# Patient Record
Sex: Male | Born: 1960 | Race: White | Hispanic: No | Marital: Married | Smoking: Former smoker
Health system: Southern US, Community
[De-identification: ages and names within clinical notes are randomized; demographics above are authoritative.]

## PROBLEM LIST (undated history)

## (undated) DIAGNOSIS — I10 Essential (primary) hypertension: Secondary | ICD-10-CM

## (undated) HISTORY — PX: VARICOSE VEIN SURGERY: SHX832

## (undated) HISTORY — PX: HERNIA REPAIR: SHX51

## (undated) HISTORY — PX: CHOLECYSTECTOMY: SHX55

## (undated) HISTORY — PX: ROTATOR CUFF REPAIR: SHX139

---

## 2019-05-29 HISTORY — PX: REPLACEMENT TOTAL KNEE BILATERAL: SUR1225

## 2020-08-04 ENCOUNTER — Other Ambulatory Visit: Payer: Self-pay

## 2020-08-04 ENCOUNTER — Emergency Department (HOSPITAL_COMMUNITY): Payer: Worker's Compensation

## 2020-08-04 ENCOUNTER — Encounter (HOSPITAL_COMMUNITY): Payer: Self-pay | Admitting: Emergency Medicine

## 2020-08-04 ENCOUNTER — Emergency Department (HOSPITAL_COMMUNITY)
Admission: EM | Admit: 2020-08-04 | Discharge: 2020-08-04 | Disposition: A | Discharge status: Home / Self Care | Payer: Worker's Compensation | Attending: Emergency Medicine | Admitting: Emergency Medicine

## 2020-08-04 DIAGNOSIS — Y990 Civilian activity done for income or pay: Secondary | ICD-10-CM | POA: Insufficient documentation

## 2020-08-04 DIAGNOSIS — Z96651 Presence of right artificial knee joint: Secondary | ICD-10-CM | POA: Insufficient documentation

## 2020-08-04 DIAGNOSIS — Z87891 Personal history of nicotine dependence: Secondary | ICD-10-CM | POA: Diagnosis not present

## 2020-08-04 DIAGNOSIS — Z91040 Latex allergy status: Secondary | ICD-10-CM | POA: Insufficient documentation

## 2020-08-04 DIAGNOSIS — I10 Essential (primary) hypertension: Secondary | ICD-10-CM | POA: Insufficient documentation

## 2020-08-04 DIAGNOSIS — W01198A Fall on same level from slipping, tripping and stumbling with subsequent striking against other object, initial encounter: Secondary | ICD-10-CM | POA: Diagnosis not present

## 2020-08-04 DIAGNOSIS — S8001XA Contusion of right knee, initial encounter: Secondary | ICD-10-CM | POA: Diagnosis not present

## 2020-08-04 DIAGNOSIS — S8991XA Unspecified injury of right lower leg, initial encounter: Secondary | ICD-10-CM | POA: Diagnosis present

## 2020-08-04 HISTORY — DX: Essential (primary) hypertension: I10

## 2020-08-04 NOTE — Discharge Instructions (Signed)
Please read instructions below. Apply ice to your knee for 20 minutes at a time.  Elevate it is much as possible to help with pain and swelling. You can take Tylenol every 6 hours as needed for pain. Schedule an appointment with the orthopedic specialist in 1 week for follow-up on your injury if symptoms persist. Return to the ER for new or concerning symptoms.

## 2020-08-04 NOTE — ED Triage Notes (Signed)
Pt c/o of rt knee pain from a fall at work today at [employer]. 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

## 2020-08-04 NOTE — ED Provider Notes (Signed)
[facility] EMERGENCY DEPARTMENT Provider Note   CSN: [id number] Arrival date & time: 08/04/20  1739     History Chief Complaint  Patient presents with  . Knee Pain    Brandon Strickland is a 59 y.o. male past medical history of hypertension, right knee placement, presenting with right knee pain after mechanical fall today at work.  He states his foot got tripped up, he fell directly onto his right knee.  He presents with concern for the wellbeing of his right knee replacement as it was done last year.  He had this done by an orthopedist in [place], he is recently relocated here.  He has not established care with PCP or orthopedist.  [facility]  He is able to weight-bear though does have a limp.  He is able to move his knee with some pain.  Pain is mostly to the anterior aspect with associated swelling.  He takes Tylenol for pain.  No other injuries reported from the fall.  The history is provided by the patient.       Past Medical History:  Diagnosis Date  . Hypertension     There are no problems to display for this patient.   Past Surgical History:  Procedure Laterality Date  . CHOLECYSTECTOMY    . HERNIA REPAIR    . 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 ROTATOR CUFF REPAIR Right   . VARICOSE VEIN SURGERY Left    4 surgery       History reviewed. No pertinent family history.  Social History   Tobacco Use  . Smoking status: Former Smoker    Quit date: 01/03/2012    Years since quitting: 8.5  . Smokeless tobacco: Never Used  Vaping Use  . Vaping Use: Never used  Substance Use Topics  . Alcohol use: Yes    Alcohol/week: 3.0 standard drinks    Types: 3 Cans of beer per week  . Drug use: Never    Home Medications Prior to Admission medications   Not on File    Allergies    Latex  Review of Systems   Review of Systems  Musculoskeletal: Positive for arthralgias.  Skin: Negative for wound.    Physical Exam Updated Vital  Signs BP (!) 150/81 (BP Location: Right Arm)   Pulse 74   Temp 98 F (36.7 C) (Oral)   Resp 18   Ht 6\' 4"  (1.93 m)   Wt 113.4 kg   SpO2 99%   BMI 30.43 kg/m   Physical Exam Vitals and nursing note reviewed.  Constitutional:      General: He is not in acute distress.    Appearance: He is well-developed.  HENT:     Head: Normocephalic and atraumatic.  Eyes:     Conjunctiva/sclera: Conjunctivae normal.  Cardiovascular:     Rate and Rhythm: Normal rate.  Pulmonary:     Effort: Pulmonary effort is normal.  Musculoskeletal:     Comments: Right knee with midline anterior surgical scar.  There is mild to moderate swelling noted.  No deformity, bruising, wounds.  He has generalized tenderness to the anterior aspect of the knee, worse along the medial and lateral joint lines.  He is able to range the knee to 90 degrees without difficulty.  Neurological:     Mental Status: He is alert.  Psychiatric:        Mood and Affect: Mood normal.        Behavior:  Behavior normal.     ED Results / Procedures / Treatments   Labs (all labs ordered are listed, but only abnormal results are displayed) Labs Reviewed - No data to display  EKG None  Radiology DG Knee Complete 4 Views Right  Result Date: 08/04/2020 CLINICAL DATA:  Fall with knee pain fall today EXAM: RIGHT KNEE - COMPLETE 4+ VIEW COMPARISON:  None. FINDINGS: The patient is status post total knee arthroplasty. No periprosthetic lucency or fracture is identified. A small knee joint effusion and prepatellar subcutaneous edema is noted. IMPRESSION: No acute osseous abnormality. Electronically Signed   By: Jonna Clark M.D.   On: 08/04/2020 19:18    Procedures Procedures (including critical care time)  Medications Ordered in ED Medications - No data to display  ED Course  I have reviewed the triage vital signs and the nursing notes.  Pertinent labs & imaging results that were available during my care of the patient were  reviewed by me and considered in my medical decision making (see chart for details).    MDM Rules/Calculators/A&P                          Patient presenting with right knee pain after trip and fall onto his right knee today at work.  He had right knee replacement done last year in [place], has recently relocated here.  He has negative plain films for periprosthetic fracture.  He has small joint effusion on x-ray, and some mild to moderate swelling on exam today.  He states he has a cane and crutches at home that he can use for nonweightbearing, he is also provided with a knee sleeve for compression and support.  He is provided with orthopedist referral for follow-up as he is relocated.  Discussed importance of ice, elevation, rest.  Tylenol as needed for pain.  Work note provided.  Patient is appropriate for discharge.  Discussed results, findings, treatment and follow up. Patient advised of return precautions. Patient verbalized understanding and agreed with plan.  Final Clinical Impression(s) / ED Diagnoses Final diagnoses:  Contusion of right knee, initial encounter    Rx / DC Orders ED Discharge Orders    None       Gabryella Murfin, [place] N, PA-C 08/04/20 2030    Dione Booze, MD 08/05/20 0010

## 2020-10-07 ENCOUNTER — Other Ambulatory Visit: Payer: Self-pay

## 2020-10-08 ENCOUNTER — Ambulatory Visit (INDEPENDENT_AMBULATORY_CARE_PROVIDER_SITE_OTHER): Payer: No Typology Code available for payment source

## 2020-10-08 ENCOUNTER — Other Ambulatory Visit: Payer: Self-pay

## 2020-10-08 ENCOUNTER — Ambulatory Visit (INDEPENDENT_AMBULATORY_CARE_PROVIDER_SITE_OTHER): Payer: No Typology Code available for payment source | Admitting: Podiatry

## 2020-10-08 DIAGNOSIS — L97522 Non-pressure chronic ulcer of other part of left foot with fat layer exposed: Secondary | ICD-10-CM

## 2020-10-08 DIAGNOSIS — M79672 Pain in left foot: Secondary | ICD-10-CM

## 2020-10-08 MED ORDER — DOXYCYCLINE HYCLATE 100 MG PO TABS: 100.0000 mg | ORAL_TABLET | Freq: Two times a day (BID) | ORAL | 0 refills | Status: DC

## 2020-10-12 ENCOUNTER — Encounter: Payer: Self-pay | Admitting: Podiatry

## 2020-10-12 NOTE — Progress Notes (Signed)
Subjective:  Patient ID: Brandon Strickland, male    DOB: [birth year],  MRN: [id number]  Chief Complaint  Patient presents with  . Wound Check    Left foot 2nd toe has an open area on the tip of the toe    60 y.o. male presents for wound care.  Patient presents with complaint of left second digit ulceration with probing down to deep bone.  Patient states been going for quite some time.  Patient states it hurts in that area.  He has not done any wound care dressing.  He has not seen anyone else prior to seeing me.  He denies any acute complaints.  He has not been taking care of the wound.  He is not a diabetic.  He states is constantly working on his foot.  He is not able to take any time off.   Review of Systems: Negative except as noted in the HPI. Denies N/V/F/Ch.  Past Medical History:  Diagnosis Date  . Hypertension     Current Outpatient Medications:  .  doxycycline (VIBRA-TABS) 100 MG tablet, Take 1 tablet (100 mg total) by mouth 2 (two) times daily., Disp: 28 tablet, Rfl: 0 .  acetaminophen (TYLENOL) 500 MG tablet, Take by mouth., Disp: , Rfl:  .  ascorbic acid (VITAMIN C) 250 MG tablet, Take by mouth., Disp: , Rfl:  .  aspirin 81 MG EC tablet, Take by mouth., Disp: , Rfl:  .  cephALEXin (KEFLEX) 500 MG capsule, Take by mouth., Disp: , Rfl:  .  Cholecalciferol 25 MCG (1000 UT) tablet, Take by mouth., Disp: , Rfl:  .  loratadine (CLARITIN) 10 MG tablet, Take by mouth., Disp: , Rfl:  .  saccharomyces boulardii (FLORASTOR) 250 MG capsule, Take by mouth., Disp: , Rfl:   Social History   Tobacco Use  Smoking Status Former Smoker  . Quit date: 01/03/2012  . Years since quitting: 8.7  Smokeless Tobacco Never Used    Allergies  Allergen Reactions  . Latex Rash   Objective:  There were no vitals filed for this visit. There is no height or weight on file to calculate BMI. Constitutional Well developed. Well nourished.  Vascular Dorsalis pedis pulses palpable  bilaterally. Posterior tibial pulses palpable bilaterally. Capillary refill normal to all digits.  No cyanosis or clubbing noted. Pedal hair growth normal.  Neurologic Normal speech. Oriented to person, place, and time. Protective sensation absent  Dermatologic Wound Location: Left second digit ulceration probes down to fat layer Wound Base: Mixed Granular/Fibrotic Peri-wound: Calloused Exudate: Scant/small amount Serous exudate Wound Measurements: -See below  Orthopedic: No pain to palpation either foot.   Radiographs: 3 views of skeletally mature adult left second digit.  No signs of osteomyelitis noted.  No cortical destruction noted.  Soft tissue deficit noted to the second digit no soft tissue emphysema noted. Assessment:   1. Toe ulcer, left, with fat layer exposed (HCC)    Plan:  Patient was evaluated and treated and all questions answered.  Ulcer left second digit with fat layer exposed -Debridement as below. -Dressed with Betadine wet-to-dry, DSD. -Patient not able to take any time off and cannot wear any closed toed shoes.  I educated him the importance of surgical shoe and to take the pressure off of the ulceration.  If he continues to get worse I discussed with him that he is a high risk of losing the digit.  He states understanding however if he does not wear steel toe boots he would lose  his job and he cannot afford to lose his job. -Doxycycline was dispensed for skin and soft tissue prophylaxis  Procedure: Excisional Debridement of Wound Tool: Sharp chisel blade/tissue nipper Rationale: Removal of non-viable soft tissue from the wound to promote healing.  Anesthesia: none Pre-Debridement Wound Measurements: 0.3 cm x 0.3 cm x 0.3 cm  Post-Debridement Wound Measurements: 0.5 cm x 0.4 cm x 0.3 cm  Type of Debridement: Sharp Excisional Tissue Removed: Non-viable soft tissue Blood loss: Minimal (<50cc) Depth of Debridement: subcutaneous tissue. Technique: Sharp  excisional debridement to bleeding, viable wound base.  Wound Progress: This is my initial evaluation I will continue to monitor the progression of the wound. Site healing conversation 7 Dressing: Dry, sterile, compression dressing. Disposition: Patient tolerated procedure well. Patient to return in 1 week for follow-up.  No follow-ups on file.

## 2020-10-29 ENCOUNTER — Ambulatory Visit (INDEPENDENT_AMBULATORY_CARE_PROVIDER_SITE_OTHER): Payer: No Typology Code available for payment source | Admitting: Podiatry

## 2020-10-29 ENCOUNTER — Other Ambulatory Visit: Payer: Self-pay

## 2020-10-29 DIAGNOSIS — L97522 Non-pressure chronic ulcer of other part of left foot with fat layer exposed: Secondary | ICD-10-CM

## 2020-11-02 ENCOUNTER — Encounter: Payer: Self-pay | Admitting: Podiatry

## 2020-11-02 NOTE — Progress Notes (Signed)
Subjective:  Patient ID: Brandon Strickland, male    DOB: 07/04/1961,  MRN: [id number]  Chief Complaint  Patient presents with  . Wound Check    PT stated that he is doing okay he has some soreness     60 y.o. male presents for wound care.  Patient presents for follow-up of left second digit ulceration.  Patient has been doing Betadine wet-to-dry dressing changes.  He states that it looks about the same.  He would like to know what the next treatment options are.   Review of Systems: Negative except as noted in the HPI. Denies N/V/F/Ch.  Past Medical History:  Diagnosis Date  . Hypertension     Current Outpatient Medications:  .  acetaminophen (TYLENOL) 500 MG tablet, Take by mouth., Disp: , Rfl:  .  ascorbic acid (VITAMIN C) 250 MG tablet, Take by mouth., Disp: , Rfl:  .  aspirin 81 MG EC tablet, Take by mouth., Disp: , Rfl:  .  cephALEXin (KEFLEX) 500 MG capsule, Take by mouth., Disp: , Rfl:  .  Cholecalciferol 25 MCG (1000 UT) tablet, Take by mouth., Disp: , Rfl:  .  doxycycline (VIBRA-TABS) 100 MG tablet, Take 1 tablet (100 mg total) by mouth 2 (two) times daily., Disp: 28 tablet, Rfl: 0 .  loratadine (CLARITIN) 10 MG tablet, Take by mouth., Disp: , Rfl:  .  saccharomyces boulardii (FLORASTOR) 250 MG capsule, Take by mouth., Disp: , Rfl:   Social History   Tobacco Use  Smoking Status Former Smoker  . Quit date: 01/03/2012  . Years since quitting: 8.8  Smokeless Tobacco Never Used    Allergies  Allergen Reactions  . Latex Rash   Objective:  There were no vitals filed for this visit. There is no height or weight on file to calculate BMI. Constitutional Well developed. Well nourished.  Vascular Dorsalis pedis pulses palpable bilaterally. Posterior tibial pulses palpable bilaterally. Capillary refill normal to all digits.  No cyanosis or clubbing noted. Pedal hair growth normal.  Neurologic Normal speech. Oriented to person, place, and time. Protective sensation absent   Dermatologic Wound Location: Left second digit ulceration probes down to fat layer Wound Base: Mixed Granular/Fibrotic Peri-wound: Calloused Exudate: Scant/small amount Serous exudate Wound Measurements: -See below  Orthopedic: No pain to palpation either foot.   Radiographs: 3 views of skeletally mature adult left second digit.  No signs of osteomyelitis noted.  No cortical destruction noted.  Soft tissue deficit noted to the second digit no soft tissue emphysema noted. Assessment:   1. Toe ulcer, left, with fat layer exposed (HCC)    Plan:  Patient was evaluated and treated and all questions answered.  Ulcer left second digit with fat layer exposed -Debridement as below. -Dressed with Betadine wet-to-dry, DSD. -Patient not able to take any time off and cannot wear any closed toed shoes.  I educated him the importance of surgical shoe and to take the pressure off of the ulceration.  If he continues to get worse I discussed with him that he is a high risk of losing the digit.  He states understanding however if he does not wear steel toe boots he would lose his job and he cannot afford to lose his job. -Doxycycline was dispensed for skin and soft tissue prophylaxis  Procedure: Excisional Debridement of Wound~stagnant Tool: Sharp chisel blade/tissue nipper Rationale: Removal of non-viable soft tissue from the wound to promote healing.  Anesthesia: none Pre-Debridement Wound Measurements: 0.3 cm x 0.3 cm x 0.3 cm  Post-Debridement Wound Measurements: 0.5 cm x 0.4 cm x 0.3 cm  Type of Debridement: Sharp Excisional Tissue Removed: Non-viable soft tissue Blood loss: Minimal (<50cc) Depth of Debridement: subcutaneous tissue. Technique: Sharp excisional debridement to bleeding, viable wound base.  Wound Progress: The wound appears to be more stagnant. Dressing: Dry, sterile, compression dressing. Disposition: Patient tolerated procedure well. Patient to return in 1 week for  follow-up.  No follow-ups on file.

## 2020-11-19 ENCOUNTER — Encounter: Payer: Self-pay | Admitting: Podiatry

## 2020-11-19 ENCOUNTER — Other Ambulatory Visit: Payer: Self-pay

## 2020-11-19 ENCOUNTER — Ambulatory Visit (INDEPENDENT_AMBULATORY_CARE_PROVIDER_SITE_OTHER): Payer: No Typology Code available for payment source | Admitting: Podiatry

## 2020-11-19 DIAGNOSIS — L97522 Non-pressure chronic ulcer of other part of left foot with fat layer exposed: Secondary | ICD-10-CM

## 2020-11-23 ENCOUNTER — Encounter: Payer: Self-pay | Admitting: Podiatry

## 2020-11-23 NOTE — Progress Notes (Signed)
Subjective:  Patient ID: Brandon Strickland, male    DOB: 1960-12-04,  MRN: [id number]  Chief Complaint  Patient presents with  . Wound Check    PT stated that he is doing well he has no concerns     60 y.o. male presents for wound care.  Patient presents for follow-up of left second digit ulceration.  Patient has been doing Betadine wet-to-dry dressing changes.  He states that it looks about the same.  He would like to know what the next treatment options are.   Review of Systems: Negative except as noted in the HPI. Denies N/V/F/Ch.  Past Medical History:  Diagnosis Date  . Hypertension     Current Outpatient Medications:  .  clobetasol ointment (TEMOVATE) 0.05 %, Apply topically., Disp: , Rfl:  .  cyclobenzaprine (FLEXERIL) 5 MG tablet, Take by mouth., Disp: , Rfl:  .  famotidine (PEPCID) 20 MG tablet, TAKE ONE TABLET BY MOUTH DAILY FOR HEARTBURN, Disp: , Rfl:  .  indomethacin (INDOCIN) 50 MG capsule, Take by mouth., Disp: , Rfl:  .  lisinopril-hydrochlorothiazide (ZESTORETIC) 10-12.5 MG tablet, Take 1 tablet by mouth daily., Disp: , Rfl:  .  lisinopril-hydrochlorothiazide (ZESTORETIC) 20-12.5 MG tablet, Take by mouth., Disp: , Rfl:  .  acetaminophen (TYLENOL) 500 MG tablet, Take by mouth., Disp: , Rfl:  .  ascorbic acid (VITAMIN C) 250 MG tablet, Take by mouth., Disp: , Rfl:  .  aspirin 81 MG EC tablet, Take by mouth., Disp: , Rfl:  .  cephALEXin (KEFLEX) 500 MG capsule, Take by mouth., Disp: , Rfl:  .  Cholecalciferol 25 MCG (1000 UT) tablet, Take by mouth., Disp: , Rfl:  .  doxycycline (VIBRA-TABS) 100 MG tablet, Take 1 tablet (100 mg total) by mouth 2 (two) times daily., Disp: 28 tablet, Rfl: 0 .  loratadine (CLARITIN) 10 MG tablet, Take by mouth., Disp: , Rfl:  .  saccharomyces boulardii (FLORASTOR) 250 MG capsule, Take by mouth., Disp: , Rfl:   Social History   Tobacco Use  Smoking Status Former Smoker  . Quit date: 01/03/2012  . Years since quitting: 8.8  Smokeless Tobacco  Never Used    Allergies  Allergen Reactions  . Latex Rash   Objective:  There were no vitals filed for this visit. There is no height or weight on file to calculate BMI. Constitutional Well developed. Well nourished.  Vascular Dorsalis pedis pulses palpable bilaterally. Posterior tibial pulses palpable bilaterally. Capillary refill normal to all digits.  No cyanosis or clubbing noted. Pedal hair growth normal.  Neurologic Normal speech. Oriented to person, place, and time. Protective sensation absent  Dermatologic Wound Location: Left second digit ulceration probes down to fat layer Wound Base: Mixed Granular/Fibrotic Peri-wound: Calloused Exudate: Scant/small amount Serous exudate Wound Measurements: -See below  Orthopedic: No pain to palpation either foot.   Radiographs: 3 views of skeletally mature adult left second digit.  No signs of osteomyelitis noted.  No cortical destruction noted.  Soft tissue deficit noted to the second digit no soft tissue emphysema noted. Assessment:   1. Toe ulcer, left, with fat layer exposed (HCC)    Plan:  Patient was evaluated and treated and all questions answered.  Ulcer left second digit with fat layer exposed -Debridement as below. -Dressed with Betadine wet-to-dry, DSD. -Patient not able to take any time off and cannot wear any closed toed shoes.  I educated him the importance of surgical shoe and to take the pressure off of the ulceration.  If  he continues to get worse I discussed with him that he is a high risk of losing the digit.  He states understanding however if he does not wear steel toe boots he would lose his job and he cannot afford to lose his job. -Doxycycline was dispensed for skin and soft tissue prophylaxis -I discussed doing flexor tenotomy to take the pressure off of the second digit and allow the soft tissue to heal appropriately.  Doing we will plan on doing tenotomy during next clinical visit if there is just not  enough improvement.  Procedure: Excisional Debridement of Wound~improving Tool: Sharp chisel blade/tissue nipper Rationale: Removal of non-viable soft tissue from the wound to promote healing.  Anesthesia: none Pre-Debridement Wound Measurements: 0.3 cm x 0.3 cm x 0.3 cm  Post-Debridement Wound Measurements: 0.4 cm x 0.4 cm x 0.3 cm  Type of Debridement: Sharp Excisional Tissue Removed: Non-viable soft tissue Blood loss: Minimal (<50cc) Depth of Debridement: subcutaneous tissue. Technique: Sharp excisional debridement to bleeding, viable wound base.  Wound Progress: The wound has slightly decreased. Dressing: Dry, sterile, compression dressing. Disposition: Patient tolerated procedure well. Patient to return in 1 week for follow-up.  No follow-ups on file.

## 2020-12-03 ENCOUNTER — Ambulatory Visit (INDEPENDENT_AMBULATORY_CARE_PROVIDER_SITE_OTHER): Payer: No Typology Code available for payment source | Admitting: Podiatry

## 2020-12-03 ENCOUNTER — Encounter: Payer: Self-pay | Admitting: Podiatry

## 2020-12-03 ENCOUNTER — Other Ambulatory Visit: Payer: Self-pay

## 2020-12-03 DIAGNOSIS — L97522 Non-pressure chronic ulcer of other part of left foot with fat layer exposed: Secondary | ICD-10-CM | POA: Diagnosis not present

## 2020-12-03 NOTE — Progress Notes (Signed)
Subjective:  Patient ID: Brandon Strickland, male    DOB: December 05, 1960,  MRN: [id number]  Chief Complaint  Patient presents with  . Foot Ulcer    Left foot toe ulcer. PT stated that he is doing well he has no concerns at this time     60 y.o. male presents for wound care.  Patient presents for follow-up of left second digit ulceration.  Patient has been doing Betadine wet-to-dry dressing changes.  He states that it has gotten better.  He would like to know what the next treatment options are.   Review of Systems: Negative except as noted in the HPI. Denies N/V/F/Ch.  Past Medical History:  Diagnosis Date  . Hypertension     Current Outpatient Medications:  .  acetaminophen (TYLENOL) 500 MG tablet, Take by mouth., Disp: , Rfl:  .  ascorbic acid (VITAMIN C) 250 MG tablet, Take by mouth., Disp: , Rfl:  .  aspirin 81 MG EC tablet, Take by mouth., Disp: , Rfl:  .  cephALEXin (KEFLEX) 500 MG capsule, Take by mouth., Disp: , Rfl:  .  Cholecalciferol 25 MCG (1000 UT) tablet, Take by mouth., Disp: , Rfl:  .  clobetasol ointment (TEMOVATE) 0.05 %, Apply topically., Disp: , Rfl:  .  cyclobenzaprine (FLEXERIL) 5 MG tablet, Take by mouth., Disp: , Rfl:  .  doxycycline (VIBRA-TABS) 100 MG tablet, Take 1 tablet (100 mg total) by mouth 2 (two) times daily., Disp: 28 tablet, Rfl: 0 .  famotidine (PEPCID) 20 MG tablet, TAKE ONE TABLET BY MOUTH DAILY FOR HEARTBURN, Disp: , Rfl:  .  indomethacin (INDOCIN) 50 MG capsule, Take by mouth., Disp: , Rfl:  .  lisinopril-hydrochlorothiazide (ZESTORETIC) 10-12.5 MG tablet, Take 1 tablet by mouth daily., Disp: , Rfl:  .  lisinopril-hydrochlorothiazide (ZESTORETIC) 20-12.5 MG tablet, Take by mouth., Disp: , Rfl:  .  loratadine (CLARITIN) 10 MG tablet, Take by mouth., Disp: , Rfl:  .  saccharomyces boulardii (FLORASTOR) 250 MG capsule, Take by mouth., Disp: , Rfl:   Social History   Tobacco Use  Smoking Status Former Smoker  . Quit date: 01/03/2012  . Years since  quitting: 8.9  Smokeless Tobacco Never Used    Allergies  Allergen Reactions  . Latex Rash   Objective:  There were no vitals filed for this visit. There is no height or weight on file to calculate BMI. Constitutional Well developed. Well nourished.  Vascular Dorsalis pedis pulses palpable bilaterally. Posterior tibial pulses palpable bilaterally. Capillary refill normal to all digits.  No cyanosis or clubbing noted. Pedal hair growth normal.  Neurologic Normal speech. Oriented to person, place, and time. Protective sensation absent  Dermatologic  left second digit ulceration completely epithelialized.  Does not probe down to bone.  No soft tissue loss noted.  No clinical signs of infection noted.  Orthopedic: No pain to palpation either foot.   Radiographs: 3 views of skeletally mature adult left second digit.  No signs of osteomyelitis noted.  No cortical destruction noted.  Soft tissue deficit noted to the second digit no soft tissue emphysema noted. Assessment:   1. Toe ulcer, left, with fat layer exposed (HCC)    Plan:  Patient was evaluated and treated and all questions answered.  Ulcer left second digit with fat layer exposed -Skin completely reepithelialized.  At this time I discussed with the patient that he will still benefit from flexor tenotomy however if the reulceration recurs then we can discuss flexor tenotomy at that time.  He  states understanding.  I discussed shoe gear modification with him.  I also discussed glucose management as well.  No follow-ups on file.

## 2020-12-31 ENCOUNTER — Encounter: Payer: Self-pay | Admitting: Physical Medicine & Rehabilitation

## 2021-02-04 ENCOUNTER — Encounter
Payer: No Typology Code available for payment source | Attending: Physical Medicine & Rehabilitation | Admitting: Physical Medicine & Rehabilitation

## 2021-02-04 ENCOUNTER — Other Ambulatory Visit: Payer: Self-pay

## 2021-02-04 ENCOUNTER — Encounter: Payer: Self-pay | Admitting: Physical Medicine & Rehabilitation

## 2021-02-04 VITALS — BP 135/76 | HR 84 | Temp 98.2°F | Ht 76.0 in | Wt 251.6 lb

## 2021-02-04 DIAGNOSIS — G8928 Other chronic postprocedural pain: Secondary | ICD-10-CM | POA: Diagnosis not present

## 2021-02-04 MED ORDER — DULOXETINE HCL 30 MG PO CPEP: 30.0000 mg | ORAL_CAPSULE | Freq: Every day | ORAL | 1 refills | Status: DC

## 2021-02-04 NOTE — Progress Notes (Signed)
Subjective:    Patient ID: Brandon Strickland, male    DOB: 06/06/1961, 60 y.o.   MRN: [id number]  HPI CC:  Bilateral knee pain 60 yo veteran referred by [facility] for the evaluation of chronic post operative knee pain.  The patient has a history of bilateral knee osteoarthritis, failed conservative care and underwent bilateral joint replacement as below. 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  He would like to retire due to his pain.  He is speaking with the [facility] about obtaining additional disability compensation.  He does not use any orthoses.  He has had no physical therapy since postoperative period  Tylenol regular strength 6-8 tabs per day Cannot work if he takes opioids Holding off on Naproxen Rx due to upcoming plastic surgery for facial lesion  Pt seen by ortho at [place] in [place], no loosening of prosthetic , orthopedic PA referred pt to eval for geniculat nerve RFA Pain Inventory Average Pain 6 Pain Right Now 8 My pain is constant, sharp, dull, stabbing, tingling, and aching  In the last 24 hours, has pain interfered with the following? General activity 2 Relation with others 3 Enjoyment of life 3 What TIME of day is your pain at its worst? morning , daytime, evening, and night Sleep (in general) NA  Pain is worse with: walking, bending, sitting, and standing Pain improves with: rest, heat/ice, and medication Relief from Meds: 3  walk with assistance use a cane ability to climb steps?  yes do you drive?  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  yes  weakness tremor tingling trouble walking spasms dizziness confusion anxiety  Any changes since  last visit?  no  Orthopedist Dr Andre Lefort [facility] physician    History reviewed. No pertinent family history. Social History   Socioeconomic History   Marital status: Married    Spouse name: Not on file   Number of children: Not on file   Years of education: Not on file   Highest education level: Not on file  Occupational History   Not on file  Tobacco Use   Smoking status: Former    Pack years: 0.00    Types: Cigarettes    Quit date: 01/03/2012    Years since quitting: 9.0   Smokeless tobacco: Never  Vaping Use   Vaping Use: Never used  Substance and Sexual Activity   Alcohol use: Yes    Alcohol/week: 3.0 standard drinks    Types: 3 Cans of beer per week   Drug use: Never   Sexual activity: Not on file  Other Topics Concern   Not on file  Social History Narrative   Not on file   Social Determinants of Health   Financial Resource Strain: Not on file  Food Insecurity: Not on file  Transportation Needs: Not on file  Physical Activity: Not on file  Stress: Not on file  Social Connections: Not on file   Past Surgical History:  Procedure Laterality Date   CHOLECYSTECTOMY     HERNIA REPAIR     REPLACEMENT TOTAL KNEE BILATERAL  05/2019   ROTATOR CUFF REPAIR Right    VARICOSE VEIN SURGERY Left  4 surgery   Past Medical History:  Diagnosis Date   Hypertension    BP 135/76   Pulse 84   Temp 98.2 F (36.8 C)   Ht 6\' 4"  (1.93 m)   Wt 251 lb 9.6 oz (114.1 kg)   SpO2 98%   BMI 30.63 kg/m   Opioid Risk Score:   Fall Risk Score:  `1  Depression screen PHQ 2/9  Depression screen PHQ 2/9 02/04/2021  Decreased Interest 3  Down, Depressed, Hopeless 1  PHQ - 2 Score 4  Altered sleeping 3  Tired, decreased energy 3  Change in appetite 0  Feeling bad or failure about yourself  2  Trouble concentrating 3  Moving slowly or fidgety/restless 3  Suicidal thoughts 0  PHQ-9 Score 18  Difficult doing work/chores Very difficult     Review of Systems   Constitutional: Negative.   HENT: Negative.    Eyes: Negative.   Respiratory: Negative.    Cardiovascular: Negative.   Gastrointestinal: Negative.   Endocrine: Negative.   Genitourinary: Negative.   Musculoskeletal:  Positive for gait problem.       Bilateral knee replacement failure  Skin: Negative.   Allergic/Immunologic: Negative.   Neurological:  Positive for tremors and weakness.       Tingling  Hematological: Negative.   Psychiatric/Behavioral:  Positive for confusion. The patient is nervous/anxious.   All other systems reviewed and are negative.     Objective:   Physical Exam Vitals and nursing note reviewed.  Constitutional:      Appearance: He is obese.  HENT:     Head: Normocephalic and atraumatic.  Eyes:     Extraocular Movements: Extraocular movements intact.     Conjunctiva/sclera: Conjunctivae normal.     Pupils: Pupils are equal, round, and reactive to light.  Cardiovascular:     Rate and Rhythm: Normal rate and regular rhythm.  Pulmonary:     Effort: Pulmonary effort is normal. No respiratory distress.     Breath sounds: Normal breath sounds.  Abdominal:     General: Abdomen is flat. Bowel sounds are normal. There is no distension.     Palpations: Abdomen is soft.  Musculoskeletal:     Cervical back: Normal range of motion.     Right lower leg: Edema present.     Left lower leg: Edema present.     Comments: -20 deg for full ext Right knee 105 deg flex right knee  -25 deg from full ext Left knee 95 deg flex Leftknee  No evidence of knee effusion bilateral   There is hypersensitivity to touch over the right knee even with light touch around the patellar area There is diffuse pain bilateral knees to palpation medial and lateral joint line supra and infrapatellar as well as popliteal space.  Skin:    General: Skin is warm and dry.     Comments: Stasis dermatitis changes bilateral legs   Neurological:     Mental Status: He is alert and oriented to  person, place, and time.  Psychiatric:        Mood and Affect: Mood normal.        Behavior: Behavior normal.        Thought Content: Thought content normal.        Judgment: Judgment normal.         Assessment & Plan:   Chronic postoperative knee pain bilateral, right greater than left.  He has some limited range of motion both with extension and  with flexion of both knees.  He has been evaluated by orthopedics and no loosening or hardware malpositioning was detected.  The patient is unable to take narcotic analgesics due to his work, he has had minimal relief with over-the-counter medications. We discussed other treatment alternatives including a trial of Voltaren gel as well as Cymbalta. We discussed genicular nerve blocks as requested by orthopedics.  This would be to determine whether or not genicular nerve radiofrequency neurotomy would be helpful. We will schedule this in 3 to 4 weeks. Discussed with patient agrees with plan

## 2021-02-04 NOTE — Patient Instructions (Addendum)
Will start with Right genicular nerve block , this is a test block that is temporary but helps predict success with radiofrequency  Try voltaren gel to both knees , it is OTC, please apply 4 x per day

## 2021-02-21 ENCOUNTER — Emergency Department (HOSPITAL_COMMUNITY)
Admission: EM | Admit: 2021-02-21 | Discharge: 2021-02-21 | Disposition: A | Discharge status: Home / Self Care | Payer: No Typology Code available for payment source | Attending: Emergency Medicine | Admitting: Emergency Medicine

## 2021-02-21 ENCOUNTER — Emergency Department (HOSPITAL_COMMUNITY): Payer: No Typology Code available for payment source

## 2021-02-21 ENCOUNTER — Other Ambulatory Visit: Payer: Self-pay

## 2021-02-21 ENCOUNTER — Encounter (HOSPITAL_COMMUNITY): Payer: Self-pay | Admitting: *Deleted

## 2021-02-21 DIAGNOSIS — Z91040 Latex allergy status: Secondary | ICD-10-CM | POA: Insufficient documentation

## 2021-02-21 DIAGNOSIS — Z87891 Personal history of nicotine dependence: Secondary | ICD-10-CM | POA: Diagnosis not present

## 2021-02-21 DIAGNOSIS — U071 COVID-19: Secondary | ICD-10-CM | POA: Diagnosis not present

## 2021-02-21 DIAGNOSIS — M7989 Other specified soft tissue disorders: Secondary | ICD-10-CM | POA: Insufficient documentation

## 2021-02-21 DIAGNOSIS — Z96653 Presence of artificial knee joint, bilateral: Secondary | ICD-10-CM | POA: Diagnosis not present

## 2021-02-21 DIAGNOSIS — R059 Cough, unspecified: Secondary | ICD-10-CM | POA: Diagnosis present

## 2021-02-21 DIAGNOSIS — L03116 Cellulitis of left lower limb: Secondary | ICD-10-CM | POA: Diagnosis not present

## 2021-02-21 DIAGNOSIS — Z79899 Other long term (current) drug therapy: Secondary | ICD-10-CM | POA: Diagnosis not present

## 2021-02-21 DIAGNOSIS — I10 Essential (primary) hypertension: Secondary | ICD-10-CM | POA: Insufficient documentation

## 2021-02-21 LAB — RESP PANEL BY RT-PCR (FLU A&B, COVID) ARPGX2
Influenza A by PCR: NEGATIVE
Influenza B by PCR: NEGATIVE
SARS Coronavirus 2 by RT PCR: POSITIVE — AB

## 2021-02-21 MED ORDER — CEPHALEXIN 500 MG PO CAPS: 500.0000 mg | ORAL_CAPSULE | Freq: Four times a day (QID) | ORAL | 0 refills | Status: AC

## 2021-02-21 NOTE — Discharge Instructions (Signed)
You were evaluated in the Emergency Department and after careful evaluation, we did not find any emergent condition requiring admission or further testing in the hospital.  Your DVT study today was negative.  As discussed, we will treat this as a skin infection of the left lower leg.  Please take cephalexin as directed until finished.  Please make sure to follow-up with your primary care doctor  Please return to the Emergency Department if you experience any worsening of your condition.    Thank you for allowing [place] to be a part of your care.

## 2021-02-21 NOTE — ED Provider Notes (Signed)
[facility] EMERGENCY DEPARTMENT Provider Note   CSN: [id number] Arrival date & time: 02/21/21  1350     History No chief complaint on file.   Brandon Strickland is a 60 y.o. male.  HPI 60 year old male with a history of hypertension presents to the ER with left leg swelling and pain.  Patient states that he started to show some URI symptoms on Friday with nasal congestion and cough.  He took a home COVID test which was negative.  He states he progressively felt better throughout the weekend but then noticed on Sunday his left leg became little more red and swollen and painful.  He reports calling his PCP today and was told to come to the ER for evaluation of blood clot.  He denies any active chest pain or shortness of breath.  He states he did have a clot in his left lower leg back in the 80s.  He is not anticoagulated.  Denies any numbness or tingling.    Past Medical History:  Diagnosis Date   Hypertension     Patient Active Problem List   Diagnosis Date Noted   Chronic postoperative pain 02/04/2021    Past Surgical History:  Procedure Laterality Date   CHOLECYSTECTOMY     HERNIA REPAIR     REPLACEMENT TOTAL KNEE BILATERAL  05/2019   ROTATOR CUFF REPAIR Right    VARICOSE VEIN SURGERY Left    4 surgery       No family history on file.  Social History   Tobacco Use   Smoking status: Former    Pack years: 0.00    Types: Cigarettes    Quit date: 01/03/2012    Years since quitting: 9.1   Smokeless tobacco: Never  Vaping Use   Vaping Use: Never used  Substance Use Topics   Alcohol use: Yes    Alcohol/week: 3.0 standard drinks    Types: 3 Cans of beer per week   Drug use: Never    Home Medications Prior to Admission medications   Medication Sig Start Date End Date Taking? Authorizing Provider  cephALEXin (KEFLEX) 500 MG capsule Take 1 capsule (500 mg total) by mouth 4 (four) times daily for 7 days. 02/21/21 02/28/21 Yes Mare Ferrari, PA-C  acetaminophen (TYLENOL)  500 MG tablet Take by mouth. 02/07/20   [provider]  ascorbic acid (VITAMIN C) 250 MG tablet Take by mouth.    [provider]  Cholecalciferol 25 MCG (1000 UT) tablet Take by mouth.    [provider]  clobetasol ointment (TEMOVATE) 0.05 % Apply topically. 07/28/15   [provider]  DULoxetine (CYMBALTA) 30 MG capsule Take 1 capsule (30 mg total) by mouth daily. 02/04/21   Kirsteins, Victorino Sparrow, MD  famotidine (PEPCID) 20 MG tablet TAKE ONE TABLET BY MOUTH DAILY FOR HEARTBURN 01/12/20   [provider]  lisinopril-hydrochlorothiazide (ZESTORETIC) 10-12.5 MG tablet Take 1 tablet by mouth daily. 01/12/20   [provider]  loratadine (CLARITIN) 10 MG tablet Take by mouth.    [provider]    Allergies    Latex and Tape  Review of Systems   Review of Systems  Constitutional:  Negative for chills and fever.  Musculoskeletal:  Positive for arthralgias.  Skin:  Positive for color change.  Neurological:  Negative for weakness and numbness.   Physical Exam Updated Vital Signs BP 137/85 (BP Location: Right Arm)   Pulse 90   Temp 98.1 F (36.7 C) (Oral)   Resp  16   Ht 6\' 4"  (1.93 m)   Wt 113.4 kg   SpO2 99%   BMI 30.43 kg/m   Physical Exam Vitals and nursing note reviewed.  Constitutional:      General: He is not in acute distress.    Appearance: He is well-developed. He is not ill-appearing, toxic-appearing or diaphoretic.  HENT:     Head: Normocephalic and atraumatic.  Eyes:     Conjunctiva/sclera: Conjunctivae normal.  Cardiovascular:     Rate and Rhythm: Normal rate and regular rhythm.     Heart sounds: No murmur heard. Pulmonary:     Effort: Pulmonary effort is normal. No respiratory distress.     Breath sounds: Normal breath sounds.  Abdominal:     Palpations: Abdomen is soft.     Tenderness: There is no abdominal tenderness.  Musculoskeletal:        General: Swelling and tenderness present.     Cervical  back: Neck supple.     Right lower leg: No edema.     Comments: Left lower extremity with area of erythema.  2+ DP pulses.<2 cap refill.  Mild warmth to the touch.  No palpable abscess.  Skin:    General: Skin is warm and dry.     Findings: Erythema present.  Neurological:     General: No focal deficit present.     Mental Status: He is alert and oriented to person, place, and time.     Sensory: No sensory deficit.     Motor: No weakness.       ED Results / Procedures / Treatments   Labs (all labs ordered are listed, but only abnormal results are displayed) Labs Reviewed  RESP PANEL BY RT-PCR (FLU A&B, COVID) [id number]    EKG None  Radiology Venous Img Lower Unilateral Left  Result Date: 02/21/2021 CLINICAL DATA:  Lower extremity edema EXAM: LEFT LOWER EXTREMITY VENOUS DUPLEX ULTRASOUND TECHNIQUE: Gray-scale sonography with graded compression, as well as color Doppler and duplex ultrasound were performed to evaluate the left lower extremity deep venous system from the level of the common femoral vein and including the common femoral, femoral, profunda femoral, popliteal and calf veins including the posterior tibial, peroneal and gastrocnemius veins when visible. The superficial great saphenous vein was also interrogated. Spectral Doppler was utilized to evaluate flow at rest and with distal augmentation maneuvers in the common femoral, femoral and popliteal veins. COMPARISON:  None. FINDINGS: Contralateral Common Femoral Vein: Respiratory phasicity is normal and symmetric with the symptomatic side. No evidence of thrombus. Normal compressibility. Common Femoral Vein: No evidence of thrombus. Normal compressibility, respiratory phasicity and response to augmentation. Saphenofemoral Junction: Not appreciable. Apparent previous greater saphenous vein stripping may result in loss of saphenofemoral junction vascularity. Profunda Femoral Vein: No evidence of thrombus. Normal compressibility and  flow on color Doppler imaging. Femoral Vein: No evidence of thrombus. Normal compressibility, respiratory phasicity and response to augmentation. Popliteal Vein: No evidence of thrombus. Normal compressibility, respiratory phasicity and response to augmentation. Calf Veins: No evidence of thrombus. Normal compressibility and flow on color Doppler imaging. Superficial Great Saphenous Vein: Previous greater saphenous vein stripping procedure. Venous Reflux:  None. Other Findings:  None. IMPRESSION: No evidence of deep venous thrombosis in the left lower extremity. Apparent previous left greater saphenous vein stripping procedure. Right common femoral vein patent. Right saphenofemoral junction visualized and patent. Electronically Signed   By: 02/23/2021 III M.D.   On: 02/21/2021 15:42    Procedures Procedures  Medications Ordered in ED Medications - No data to display  ED Course  I have reviewed the triage vital signs and the nursing notes.  Pertinent labs & imaging results that were available during my care of the patient were reviewed by me and considered in my medical decision making (see chart for details).    MDM Rules/Calculators/A&P                          60 year old male with left lower extremity erythema and swelling x2 days.  On arrival, is well-appearing, no acute distress, resting comfortably in the ER bed.  Afebrile, not tachycardic, tachypneic or hypoxic.  No chest pain or shortness of breath to suggest PE.  Left lower extremity DVT study without evidence of clot, he does have apparent previous left greater saphenous vein stripping procedure.  Will treat as cellulitis, encouraged PCP follow-up.  COVID test pending.  Overall well-appearing.  Low suspicion for sepsis.  We discussed return precautions.  Patient was understanding is agreeable.  Stable for discharge Final Clinical Impression(s) / ED Diagnoses Final diagnoses:  Left leg cellulitis    Rx / DC Orders ED  Discharge Orders          Ordered    cephALEXin (KEFLEX) 500 MG capsule  4 times daily        02/21/21 1615             Mare Ferrari, PA-C 02/21/21 1646    Sabas Sous, MD 02/23/21 [phone]

## 2021-02-21 NOTE — ED Triage Notes (Signed)
Referred by PCP for possible blood clot in left leg

## 2021-03-22 ENCOUNTER — Encounter: Payer: Self-pay | Admitting: Physical Medicine & Rehabilitation

## 2021-03-22 ENCOUNTER — Other Ambulatory Visit: Payer: Self-pay

## 2021-03-22 ENCOUNTER — Encounter
Payer: No Typology Code available for payment source | Attending: Physical Medicine & Rehabilitation | Admitting: Physical Medicine & Rehabilitation

## 2021-03-22 VITALS — BP 141/80 | HR 79 | Temp 98.2°F | Ht 76.0 in | Wt 251.8 lb

## 2021-03-22 DIAGNOSIS — G8928 Other chronic postprocedural pain: Secondary | ICD-10-CM | POA: Insufficient documentation

## 2021-03-22 DIAGNOSIS — L03116 Cellulitis of left lower limb: Secondary | ICD-10-CM | POA: Diagnosis not present

## 2021-03-22 DIAGNOSIS — A4151 Sepsis due to Escherichia coli [E. coli]: Secondary | ICD-10-CM | POA: Diagnosis not present

## 2021-03-22 NOTE — Patient Instructions (Signed)
Genicular nerve blocks were performed today. This is to block pain signals from the knee. A local anesthetic was used to block the knee therefore this will not be a permanent procedure. Please keep track of your pain today and compared to the pain that you had prior to the injection. At next visit we will discuss the results. If it is helpful but only short-term we would need to confirm this with an additional injection prior to proceeding with a radiofrequency neurotomy  °

## 2021-03-22 NOTE — Progress Notes (Signed)
RIght  genicular nerve blocks under fluoroscopic guidance  Indication Chronic severe post operative knee pain that has not responded to PT, medication, and other conservative care.  Informed consent was obtained after discussing risks and benefits of procedure with the patient.  These include bleeding, bruising and infection as well as foot numbness. Pt placed in supine position on the fluoro table .  Static images identified distal femur and proximal tibia.  Lateral and medial supracondylar , as well as medial tibial flare prepped with betadine.  Marked under fluoro and then a 25 g 1.5 inch needle was used to anesthetize skin and subcutaneous tissue. 1.5 cc was infiltrate into each of 3 sites. Then a 22-gauge 3.5 inch needle was inserted under fluoroscopic guidance first targeting the medial tibial flare midpoint. After bone contact was made lateral images confirmed proper positioning and Isovue 200 times one ML was injected. This showed no evidence of intravascular uptake. Then 1.5 ML of the solution containing one ML of Celestone 6 mg per mL with 4 ML of .25%marcaincaine was injected. This same procedure was treated for the lateral and medial supracondylar areas. Patient tolerated procedure well. Post procedure instructions given.   Preinjection Right knee pain 7/10 Post injection RIght knee pain 3/10

## 2021-03-22 NOTE — Progress Notes (Signed)
  PROCEDURE RECORD [facility]   Name: Brandon Strickland DOB:1960-09-06 MRN: [id number]  Date:03/22/2021  Physician: Claudette Laws, MD    Nurse/CMA: Nedra Hai CMA  Allergies:  Allergies  Allergen Reactions   Latex Rash   Tape     Consent Signed: Yes.    Is patient diabetic? No.  CBG today?   Pregnant: No. LMP: No LMP for male patient. (age [age])  Anticoagulants: no Anti-inflammatory: no Antibiotics: no  Procedure: right genicular nerve block  Position: Supine Start Time: 11:02 am End Time: 11:16am  Fluoro Time: 49  RN/CMA Brandon Stecklein RN Nedra Hai CMA    Time 1034 11:27am    BP 141/80 91/53    Pulse 79 79    Respirations 14 14    O2 Sat 99 99    S/S 6 6    Pain Level 7/10 4/10     Patient A & O X 3, D/C instructions reviewed, and sits independently.

## 2021-03-25 ENCOUNTER — Inpatient Hospital Stay (HOSPITAL_COMMUNITY)
Admission: EM | Admit: 2021-03-25 | Discharge: 2021-03-28 | DRG: 872 | Disposition: A | Discharge status: Home / Self Care | Payer: No Typology Code available for payment source | Attending: Family Medicine | Admitting: Family Medicine

## 2021-03-25 ENCOUNTER — Emergency Department (HOSPITAL_COMMUNITY): Payer: No Typology Code available for payment source

## 2021-03-25 ENCOUNTER — Encounter (HOSPITAL_COMMUNITY): Payer: Self-pay | Admitting: Emergency Medicine

## 2021-03-25 ENCOUNTER — Other Ambulatory Visit: Payer: Self-pay

## 2021-03-25 DIAGNOSIS — Z91048 Other nonmedicinal substance allergy status: Secondary | ICD-10-CM

## 2021-03-25 DIAGNOSIS — Z87891 Personal history of nicotine dependence: Secondary | ICD-10-CM

## 2021-03-25 DIAGNOSIS — A419 Sepsis, unspecified organism: Secondary | ICD-10-CM | POA: Diagnosis not present

## 2021-03-25 DIAGNOSIS — B9620 Unspecified Escherichia coli [E. coli] as the cause of diseases classified elsewhere: Secondary | ICD-10-CM | POA: Diagnosis not present

## 2021-03-25 DIAGNOSIS — E669 Obesity, unspecified: Secondary | ICD-10-CM | POA: Diagnosis present

## 2021-03-25 DIAGNOSIS — A4151 Sepsis due to Escherichia coli [E. coli]: Secondary | ICD-10-CM | POA: Diagnosis present

## 2021-03-25 DIAGNOSIS — L02416 Cutaneous abscess of left lower limb: Secondary | ICD-10-CM | POA: Diagnosis present

## 2021-03-25 DIAGNOSIS — I8392 Asymptomatic varicose veins of left lower extremity: Secondary | ICD-10-CM | POA: Diagnosis present

## 2021-03-25 DIAGNOSIS — K219 Gastro-esophageal reflux disease without esophagitis: Secondary | ICD-10-CM | POA: Diagnosis present

## 2021-03-25 DIAGNOSIS — Z79899 Other long term (current) drug therapy: Secondary | ICD-10-CM | POA: Diagnosis not present

## 2021-03-25 DIAGNOSIS — I10 Essential (primary) hypertension: Secondary | ICD-10-CM | POA: Diagnosis present

## 2021-03-25 DIAGNOSIS — I878 Other specified disorders of veins: Secondary | ICD-10-CM | POA: Diagnosis present

## 2021-03-25 DIAGNOSIS — G629 Polyneuropathy, unspecified: Secondary | ICD-10-CM | POA: Diagnosis present

## 2021-03-25 DIAGNOSIS — Z6830 Body mass index (BMI) 30.0-30.9, adult: Secondary | ICD-10-CM

## 2021-03-25 DIAGNOSIS — L02612 Cutaneous abscess of left foot: Secondary | ICD-10-CM | POA: Diagnosis present

## 2021-03-25 DIAGNOSIS — R7881 Bacteremia: Secondary | ICD-10-CM | POA: Diagnosis present

## 2021-03-25 DIAGNOSIS — L03116 Cellulitis of left lower limb: Secondary | ICD-10-CM

## 2021-03-25 DIAGNOSIS — R609 Edema, unspecified: Secondary | ICD-10-CM

## 2021-03-25 DIAGNOSIS — Z91040 Latex allergy status: Secondary | ICD-10-CM | POA: Diagnosis not present

## 2021-03-25 DIAGNOSIS — I872 Venous insufficiency (chronic) (peripheral): Secondary | ICD-10-CM | POA: Diagnosis present

## 2021-03-25 DIAGNOSIS — Z20822 Contact with and (suspected) exposure to covid-19: Secondary | ICD-10-CM | POA: Diagnosis present

## 2021-03-25 LAB — RESP PANEL BY RT-PCR (FLU A&B, COVID) ARPGX2
Influenza A by PCR: NEGATIVE
Influenza B by PCR: NEGATIVE
SARS Coronavirus 2 by RT PCR: NEGATIVE

## 2021-03-25 LAB — CBC WITH DIFFERENTIAL/PLATELET
Abs Immature Granulocytes: 0.07 10*3/uL (ref 0.00–0.07)
Basophils Absolute: 0 10*3/uL (ref 0.0–0.1)
Basophils Relative: 0 %
Eosinophils Absolute: 0.1 10*3/uL (ref 0.0–0.5)
Eosinophils Relative: 1 %
HCT: 41.3 % (ref 39.0–52.0)
Hemoglobin: 14 g/dL (ref 13.0–17.0)
Immature Granulocytes: 1 %
Lymphocytes Relative: 6 %
Lymphs Abs: 0.7 10*3/uL (ref 0.7–4.0)
MCH: 33.8 pg (ref 26.0–34.0)
MCHC: 33.9 g/dL (ref 30.0–36.0)
MCV: 99.8 fL (ref 80.0–100.0)
Monocytes Absolute: 0.1 10*3/uL (ref 0.1–1.0)
Monocytes Relative: 1 %
Neutro Abs: 9.5 10*3/uL — ABNORMAL HIGH (ref 1.7–7.7)
Neutrophils Relative %: 91 %
Platelets: 191 10*3/uL (ref 150–400)
RBC: 4.14 MIL/uL — ABNORMAL LOW (ref 4.22–5.81)
RDW: 13.2 % (ref 11.5–15.5)
WBC: 10.3 10*3/uL (ref 4.0–10.5)
nRBC: 0 % (ref 0.0–0.2)

## 2021-03-25 LAB — COMPREHENSIVE METABOLIC PANEL
ALT: 23 U/L (ref 0–44)
AST: 19 U/L (ref 15–41)
Albumin: 4.1 g/dL (ref 3.5–5.0)
Alkaline Phosphatase: 59 U/L (ref 38–126)
Anion gap: 7 (ref 5–15)
BUN: 22 mg/dL (ref 8–23)
CO2: 26 mmol/L (ref 22–32)
Calcium: 9 mg/dL (ref 8.9–10.3)
Chloride: 105 mmol/L (ref 98–111)
Creatinine, Ser: 0.86 mg/dL (ref 0.61–1.24)
GFR, Estimated: >60 mL/min (ref >60)
Glucose, Bld: 102 mg/dL — ABNORMAL HIGH (ref 70–99)
Potassium: 3.8 mmol/L (ref 3.5–5.1)
Sodium: 138 mmol/L (ref 135–145)
Total Bilirubin: 1.1 mg/dL (ref 0.3–1.2)
Total Protein: 7.3 g/dL (ref 6.5–8.1)

## 2021-03-25 LAB — URINALYSIS, ROUTINE W REFLEX MICROSCOPIC
Bilirubin Urine: NEGATIVE
Glucose, UA: NEGATIVE mg/dL
Hgb urine dipstick: NEGATIVE
Ketones, ur: NEGATIVE mg/dL
Leukocytes,Ua: NEGATIVE
Nitrite: NEGATIVE
Protein, ur: NEGATIVE mg/dL
Specific Gravity, Urine: 1.025 (ref 1.005–1.030)
pH: 8 (ref 5.0–8.0)

## 2021-03-25 LAB — LACTIC ACID, PLASMA
Lactic Acid, Venous: 1.6 mmol/L (ref 0.5–1.9)
Lactic Acid, Venous: 1.7 mmol/L (ref 0.5–1.9)

## 2021-03-25 LAB — HIV ANTIBODY (ROUTINE TESTING W REFLEX): HIV Screen 4th Generation wRfx: NONREACTIVE

## 2021-03-25 LAB — APTT: aPTT: 22 seconds — ABNORMAL LOW (ref 24–36)

## 2021-03-25 LAB — PROTIME-INR
INR: 1.1 (ref 0.8–1.2)
Prothrombin Time: 13.8 seconds (ref 11.4–15.2)

## 2021-03-25 LAB — D-DIMER, QUANTITATIVE: D-Dimer, Quant: 1.71 ug/mL-FEU — ABNORMAL HIGH (ref 0.00–0.50)

## 2021-03-25 MED ORDER — ACETAMINOPHEN 650 MG RE SUPP: 650.0000 mg | Freq: Four times a day (QID) | RECTAL | Status: DC | PRN

## 2021-03-25 MED ORDER — ONDANSETRON HCL 4 MG PO TABS: 4.0000 mg | ORAL_TABLET | Freq: Four times a day (QID) | ORAL | Status: DC | PRN

## 2021-03-25 MED ORDER — HYDROCHLOROTHIAZIDE 12.5 MG PO CAPS
12.5000 mg | ORAL_CAPSULE | Freq: Every day | ORAL | Status: DC
  Administered 2021-03-25 – 2021-03-28 (×4): 12.5 mg via ORAL
  Filled 2021-03-25 (×4): qty 1

## 2021-03-25 MED ORDER — DULOXETINE HCL 30 MG PO CPEP: 30.0000 mg | ORAL_CAPSULE | Freq: Every day | ORAL | Status: DC

## 2021-03-25 MED ORDER — LISINOPRIL-HYDROCHLOROTHIAZIDE 10-12.5 MG PO TABS: 1.0000 | ORAL_TABLET | Freq: Every day | ORAL | Status: DC

## 2021-03-25 MED ORDER — ENOXAPARIN SODIUM 60 MG/0.6ML IJ SOSY
55.0000 mg | PREFILLED_SYRINGE | INTRAMUSCULAR | Status: DC
  Administered 2021-03-25 – 2021-03-27 (×3): 55 mg via SUBCUTANEOUS
  Filled 2021-03-25 (×3): qty 0.6

## 2021-03-25 MED ORDER — ASCORBIC ACID 500 MG PO TABS
500.0000 mg | ORAL_TABLET | Freq: Every day | ORAL | Status: DC
  Administered 2021-03-25 – 2021-03-28 (×4): 500 mg via ORAL
  Filled 2021-03-25 (×4): qty 1

## 2021-03-25 MED ORDER — LACTATED RINGERS IV BOLUS (SEPSIS)
1000.0000 mL | Freq: Once | INTRAVENOUS | Status: AC
Start: 2021-03-25 — End: 2021-03-25
  Administered 2021-03-25: 1000 mL via INTRAVENOUS

## 2021-03-25 MED ORDER — LISINOPRIL 10 MG PO TABS
10.0000 mg | ORAL_TABLET | Freq: Every day | ORAL | Status: DC
  Administered 2021-03-25 – 2021-03-28 (×4): 10 mg via ORAL
  Filled 2021-03-25 (×4): qty 1

## 2021-03-25 MED ORDER — LORATADINE 10 MG PO TABS
10.0000 mg | ORAL_TABLET | Freq: Every day | ORAL | Status: DC
  Administered 2021-03-25 – 2021-03-28 (×4): 10 mg via ORAL
  Filled 2021-03-25 (×4): qty 1

## 2021-03-25 MED ORDER — ACETAMINOPHEN 500 MG PO TABS
1000.0000 mg | ORAL_TABLET | Freq: Once | ORAL | Status: AC
  Administered 2021-03-25: 1000 mg via ORAL
  Filled 2021-03-25: qty 2

## 2021-03-25 MED ORDER — LACTATED RINGERS IV BOLUS (SEPSIS)
1000.0000 mL | Freq: Once | INTRAVENOUS | Status: AC
  Administered 2021-03-25: 1000 mL via INTRAVENOUS

## 2021-03-25 MED ORDER — FAMOTIDINE 20 MG PO TABS
20.0000 mg | ORAL_TABLET | Freq: Every day | ORAL | Status: DC
  Administered 2021-03-25 – 2021-03-28 (×4): 20 mg via ORAL
  Filled 2021-03-25 (×4): qty 1

## 2021-03-25 MED ORDER — SODIUM CHLORIDE 0.9 % IV SOLN
2.0000 g | INTRAVENOUS | Status: DC
  Administered 2021-03-25 – 2021-03-27 (×3): 2 g via INTRAVENOUS
  Filled 2021-03-25 (×3): qty 20

## 2021-03-25 MED ORDER — ACETAMINOPHEN 325 MG PO TABS
650.0000 mg | ORAL_TABLET | Freq: Four times a day (QID) | ORAL | Status: DC | PRN
  Administered 2021-03-25 – 2021-03-27 (×4): 650 mg via ORAL
  Filled 2021-03-25 (×4): qty 2

## 2021-03-25 MED ORDER — SODIUM CHLORIDE 0.9% FLUSH
3.0000 mL | Freq: Two times a day (BID) | INTRAVENOUS | Status: DC
  Administered 2021-03-27 – 2021-03-28 (×3): 3 mL via INTRAVENOUS

## 2021-03-25 MED ORDER — SODIUM CHLORIDE 0.9 % IV SOLN: 2.0000 g | INTRAVENOUS | Status: DC

## 2021-03-25 MED ORDER — ONDANSETRON HCL 4 MG/2ML IJ SOLN: 4.0000 mg | Freq: Four times a day (QID) | INTRAMUSCULAR | Status: DC | PRN

## 2021-03-25 MED ORDER — IOHEXOL 350 MG/ML SOLN
100.0000 mL | Freq: Once | INTRAVENOUS | Status: AC | PRN
  Administered 2021-03-25: 100 mL via INTRAVENOUS

## 2021-03-25 MED ORDER — LACTATED RINGERS IV SOLN: INTRAVENOUS | Status: DC

## 2021-03-25 MED ORDER — LACTATED RINGERS IV BOLUS (SEPSIS)
500.0000 mL | Freq: Once | INTRAVENOUS | Status: AC
  Administered 2021-03-25: 500 mL via INTRAVENOUS

## 2021-03-25 MED ORDER — SODIUM CHLORIDE 0.9 % IV SOLN: 250.0000 mL | INTRAVENOUS | Status: DC | PRN

## 2021-03-25 MED ORDER — SODIUM CHLORIDE 0.9% FLUSH: 3.0000 mL | INTRAVENOUS | Status: DC | PRN

## 2021-03-25 NOTE — Sepsis Progress Note (Signed)
eLink is monitoring this Code Sepsis. °

## 2021-03-25 NOTE — Progress Notes (Signed)
Lab called to state patient had two positive blood cultures for gram negative rods

## 2021-03-25 NOTE — ED Triage Notes (Signed)
Pt c/o left leg pain that started this morning. Leg is red and swollen. Pt states he just finished antibiotics a month ago for cellulitis. Pt has known covid exposure on Tuesday. Also c/o cough and fever.

## 2021-03-25 NOTE — H&P (Addendum)
History and Physical    Brandon Strickland ZOX:[id number] DOB: 1961-08-18 DOA: 03/25/2021  PCP: Orvilla Cornwall, MD   Patient coming from: Home  I have personally briefly reviewed patient's old medical records in [facility]  Chief Complaint: Left leg pain  HPI: Brandon Strickland is a 60 y.o. male with medical history significant for hypertension who presents to the emergency room for evaluation of pain involving his left leg which started on the day of admission.  He rates his pain a 6 x 10 in intensity at its worst.  Pain is nonradiating and is associated with redness, fever and chills.  Patient states that he recently completed antibiotics a month ago for cellulitis and also notes that he had a recent COVID exposure couple of days prior to his admission. He denies any recent trauma, no falls, no abdominal pain, no changes in his bowel habits, no urinary symptoms, no dizziness, no lightheadedness, no headache, no palpitations, no diaphoresis, no chest pain or shortness of breath. Labs show sodium 138, potassium 3.8, chloride 105, bicarb 26, glucose 102, BUN 22, creatinine 0.86, calcium 9.0, alkaline phosphatase 59, albumin 4.1, AST 19, ALT 23, total protein 7.3, lactic acid 1.6, white count 10.3, hemoglobin 14.0, hematocrit 41.3, MCV 99.8, RDW 13.2, platelet count 191, D-dimer 1.79, PT 13.8, INR 1.1 Respiratory viral panel is negative Chest x-ray reviewed by me shows Mild cardiomegaly and central pulmonary vascular congestion without overt pulmonary edema. No consolidation. Left lower extremity venous Doppler is negative for DVT but shows dilated varicose veins of the left lower extremity related to chronic venous insufficiency. CT angiogram of the chest is negative for acute pulmonary embolism, pneumonia or acute cardiopulmonary process. Twelve-lead EKG reviewed by me shows sinus tachycardia with left anterior fascicular block and right bundle branch block   ED Course: Patient is a 60 year old male  who presents to the ER for evaluation of pain in his left leg associated with redness, swelling and differential warmth as well as fever and chills.  He had a T-max of 103F upon arrival to the ER and was tachycardic and tachypneic. Lactic acid level was within normal limits and he has no leukocytosis. He received 3.5 L fluid bolus as well as a dose of Rocephin 2 g IV x1 He will be admitted to the hospital for further evaluation.    Review of Systems: As per HPI otherwise all other systems reviewed and negative.    Past Medical History:  Diagnosis Date   Hypertension     Past Surgical History:  Procedure Laterality Date   CHOLECYSTECTOMY     HERNIA REPAIR     REPLACEMENT TOTAL KNEE BILATERAL  05/2019   ROTATOR CUFF REPAIR Right    VARICOSE VEIN SURGERY Left    4 surgery     reports that he quit smoking about 9 years ago. His smoking use included cigarettes. He has never used smokeless tobacco. He reports current alcohol use of about 3.0 standard drinks of alcohol per week. He reports that he does not use drugs.  Allergies  Allergen Reactions   Latex Rash    Other reaction(s): rash/itching   Tape     Family History  Problem Relation Age of Onset   Diabetes Mellitus II Mother       Prior to Admission medications   Medication Sig Start Date End Date Taking? Authorizing Provider  acetaminophen (TYLENOL) 500 MG tablet Take by mouth. 02/07/20   [provider]  ascorbic acid (VITAMIN C) 250 MG  tablet Take by mouth.    [provider]  Cholecalciferol 25 MCG (1000 UT) tablet Take by mouth.    [provider]  clobetasol ointment (TEMOVATE) 0.05 % Apply topically. 07/28/15   [provider]  DULoxetine (CYMBALTA) 30 MG capsule Take 1 capsule (30 mg total) by mouth daily. 02/04/21   Kirsteins, Victorino SparrowAndrew E, MD  famotidine (PEPCID) 20 MG tablet TAKE ONE TABLET BY MOUTH DAILY FOR HEARTBURN 01/12/20   [provider]   lisinopril-hydrochlorothiazide (ZESTORETIC) 10-12.5 MG tablet Take 1 tablet by mouth daily. 01/12/20   [provider]  loratadine (CLARITIN) 10 MG tablet Take by mouth.    [provider]  naproxen (NAPROSYN) 500 MG tablet TAKE ONE TABLET BY MOUTH TWICE A DAY AS NEEDED FOR PAIN (TAKE WITH FOOD) 01/22/21   [provider]    Physical Exam: Vitals:   03/25/21 1330 03/25/21 1400 03/25/21 1430 03/25/21 1500  BP: 137/61   109/68  Pulse: (!) 106 (!) 106 (!) 103 95  Resp: (!) 22 17 (!) 21 (!) 27  Temp:      TempSrc:      SpO2: 96% 96% 96% 95%  Weight:      Height:         Vitals:   03/25/21 1330 03/25/21 1400 03/25/21 1430 03/25/21 1500  BP: 137/61   109/68  Pulse: (!) 106 (!) 106 (!) 103 95  Resp: (!) 22 17 (!) 21 (!) 27  Temp:      TempSrc:      SpO2: 96% 96% 96% 95%  Weight:      Height:          Constitutional: Alert and oriented x 3 . Not in any apparent distress HEENT:      Head: Normocephalic and atraumatic.         Eyes: PERLA, EOMI, Conjunctivae are normal. Sclera is non-icteric.       Mouth/Throat: Mucous membranes are moist.       Neck: Supple with no signs of meningismus. Cardiovascular: Tachycardic. No murmurs, gallops, or rubs. 2+ symmetrical distal pulses are present . No JVD.  Left lower extremity swelling Respiratory: Respiratory effort normal .Lungs sounds clear bilaterally. No wheezes, crackles, or rhonchi.  Gastrointestinal: Soft, non tender, and non distended with positive bowel sounds.  Genitourinary: No CVA tenderness. Musculoskeletal: Significant swelling involving the left leg when compared to the right leg with associated redness over the left shin and differential warmth.  Callus over the left second toe Neurologic:  Face is symmetric. Moving all extremities. No gross focal neurologic deficits . Skin: Skin is warm, dry.  No rash or ulcer Psychiatric: Mood and affect are normal    Labs on Admission: I have personally  reviewed following labs and imaging studies  CBC: Recent Labs  Lab 03/25/21 1125  WBC 10.3  NEUTROABS 9.5*  HGB 14.0  HCT 41.3  MCV 99.8  PLT 191   Basic Metabolic Panel: Recent Labs  Lab 03/25/21 1125  NA 138  K 3.8  CL 105  CO2 26  GLUCOSE 102*  BUN 22  CREATININE 0.86  CALCIUM 9.0   GFR: Estimated Creatinine Clearance: 124.3 mL/min (by C-G formula based on SCr of 0.86 mg/dL). Liver Function Tests: Recent Labs  Lab 03/25/21 1125  AST 19  ALT 23  ALKPHOS 59  BILITOT 1.1  PROT 7.3  ALBUMIN 4.1   No results for input(s): LIPASE, AMYLASE in the last 168 hours. No results for input(s): AMMONIA  in the last 168 hours. Coagulation Profile: Recent Labs  Lab 03/25/21 1125  INR 1.1   Cardiac Enzymes: No results for input(s): CKTOTAL, CKMB, CKMBINDEX, TROPONINI in the last 168 hours. BNP (last 3 results) No results for input(s): PROBNP in the last 8760 hours. HbA1C: No results for input(s): HGBA1C in the last 72 hours. CBG: No results for input(s): GLUCAP in the last 168 hours. Lipid Profile: No results for input(s): CHOL, HDL, LDLCALC, TRIG, CHOLHDL, LDLDIRECT in the last 72 hours. Thyroid Function Tests: No results for input(s): TSH, T4TOTAL, FREET4, T3FREE, THYROIDAB in the last 72 hours. Anemia Panel: No results for input(s): VITAMINB12, FOLATE, FERRITIN, TIBC, IRON, RETICCTPCT in the last 72 hours. Urine analysis:    Component Value Date/Time   COLORURINE STRAW (A) 03/25/2021 1445   APPEARANCEUR CLEAR 03/25/2021 1445   LABSPEC 1.025 03/25/2021 1445   PHURINE 8.0 03/25/2021 1445   GLUCOSEU NEGATIVE 03/25/2021 1445   HGBUR NEGATIVE 03/25/2021 1445   BILIRUBINUR NEGATIVE 03/25/2021 1445   KETONESUR NEGATIVE 03/25/2021 1445   PROTEINUR NEGATIVE 03/25/2021 1445   NITRITE NEGATIVE 03/25/2021 1445   LEUKOCYTESUR NEGATIVE 03/25/2021 1445    Radiological Exams on Admission: CT Angio Chest PE W and/or Wo Contrast  Result Date: 03/25/2021 CLINICAL DATA:   60 year old male with cough, fever and elevated D-dimer with left leg redness and swelling. EXAM: CT ANGIOGRAPHY CHEST WITH CONTRAST TECHNIQUE: Multidetector CT imaging of the chest was performed using the standard protocol during bolus administration of intravenous contrast. Multiplanar CT image reconstructions and MIPs were obtained to evaluate the vascular anatomy. CONTRAST:  OMNIPAQUE IOHEXOL 350 MG/ML SOLN COMPARISON:  None. FINDINGS: Cardiovascular: Satisfactory opacification of the pulmonary arteries to the segmental level. No evidence of pulmonary embolism. Normal heart size. No pericardial effusion. Mild atherosclerotic calcifications along the thoracic aorta. Mediastinum/Nodes: No enlarged mediastinal, hilar, or axillary lymph nodes. Thyroid gland, trachea, and esophagus demonstrate no significant findings. Lungs/Pleura: Lungs are clear. No pleural effusion or pneumothorax. Upper Abdomen: No acute abnormality. Surgical changes of prior cholecystectomy. Musculoskeletal: No chest wall abnormality. No acute or significant osseous findings. Review of the MIP images confirms the above findings. IMPRESSION: Negative for acute pulmonary embolus, pneumonia or other acute cardiopulmonary process. Aortic Atherosclerosis (ICD10-170.0) Electronically Signed   By: Malachy Moan M.D.   On: 03/25/2021 14:28   US Venous Img Lower  Left (DVT Study)  Result Date: 03/25/2021 CLINICAL DATA:  60 year old male with a history of left lower extremity pain EXAM: LEFT LOWER EXTREMITY VENOUS DOPPLER ULTRASOUND TECHNIQUE: Gray-scale sonography with graded compression, as well as color Doppler and duplex ultrasound were performed to evaluate the lower extremity deep venous systems from the level of the common femoral vein and including the common femoral, femoral, profunda femoral, popliteal and calf veins including the posterior tibial, peroneal and gastrocnemius veins when visible. The superficial great saphenous vein  was also interrogated. Spectral Doppler was utilized to evaluate flow at rest and with distal augmentation maneuvers in the common femoral, femoral and popliteal veins. COMPARISON:  None. FINDINGS: Contralateral Common Femoral Vein: Respiratory phasicity is normal and symmetric with the symptomatic side. No evidence of thrombus. Normal compressibility. Common Femoral Vein: No evidence of thrombus. Normal compressibility, respiratory phasicity and response to augmentation. Saphenofemoral Junction: No evidence of thrombus. Normal compressibility and flow on color Doppler imaging. Profunda Femoral Vein: No evidence of thrombus. Normal compressibility and flow on color Doppler imaging. Femoral Vein: No evidence of thrombus. Normal compressibility, respiratory phasicity and response to augmentation. Popliteal Vein:  No evidence of thrombus. Normal compressibility, respiratory phasicity and response to augmentation. Calf Veins: No evidence of thrombus. Normal compressibility and flow on color Doppler imaging. Superficial Great Saphenous Vein: No evidence of thrombus. Normal compressibility and flow on color Doppler imaging. Other Findings: Dilated varicose veins of the left leg. Superficial edema. IMPRESSION: Sonographic survey of the left lower extremity negative for DVT. 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 Left lower extremity edema, nonspecific Electronically Signed   By: Gilmer Mor D.O.   On: 03/25/2021 13:47   DG Chest Port 1 View  Result Date: 03/25/2021 CLINICAL DATA:  Questionable sepsis - evaluate for abnormality EXAM: PORTABLE CHEST 1 VIEW COMPARISON:  None. FINDINGS: Mild enlargement the cardiac silhouette. Central pulmonary vascular congestion. Mildly prominent lung markings without consolidation. No visible pleural effusions or pneumothorax on this single AP radiograph. IMPRESSION: Mild cardiomegaly and central pulmonary vascular  congestion without overt pulmonary edema. No consolidation. Electronically Signed   By: Feliberto Harts MD   On: 03/25/2021 12:12     Assessment/Plan Principal Problem:   Cellulitis and abscess of left leg Active Problems:   Hypertension   Venous insufficiency of left lower extremity     Left lower extremity cellulitis In a patient with a known history of chronic venous insufficiency involving his left leg who presents for evaluation of pain, fever and chills associated with differential warmth and redness. Will continue Rocephin 2 g IV initiated in the ER Keep left lower extremity elevated    Hypertension Continue lisinopril/HCTZ       DVT prophylaxis: Lovenox  Code Status: full code  Family Communication: Greater than 50% of time was spent discussing patient's condition and plan of care with him at the bedside.  All questions and concerns have been addressed.  He verbalizes understanding and agrees with the plan.  CODE STATUS was discussed and he is a full code. Disposition Plan: Back to previous home environment Consults called: none  Status: At the time of admission, it appears that the appropriate admission status for this patient is inpatient. This is judged to be reasonable and necessary in order to provide the required intensity of service to ensure the patient's safety given the presenting symptoms, physical exam findings, and initial radiographic and laboratory data in the context of their comorbid conditions. Patient requires inpatient status due to high intensity of service, high risk for further deterioration and high frequency of surveillance required.    Lucile Shutters MD [facility]     03/25/2021, 4:02 PM

## 2021-03-25 NOTE — ED Notes (Signed)
ED TO INPATIENT HANDOFF REPORT  ED Nurse Name and Phone #:  [phone]  S Name/Age/Gender Brandon Strickland 60 y.o. male Room/Bed: APA01/APA01  Code Status   Code Status: Full Code  Home/SNF/Other Home Patient oriented to: self, place, time and situation Is this baseline? Yes   Triage Complete: Triage complete  Chief Complaint Cellulitis and abscess of left leg [L03.116, L02.416]  Triage Note Pt c/o left leg pain that started this morning. Leg is red and swollen. Pt states he just finished antibiotics a month ago for cellulitis. Pt has known covid exposure on Tuesday. Also c/o cough and fever.     Allergies Allergies  Allergen Reactions  . Latex Rash    Other reaction(s): rash/itching  . Tape     Level of Care/Admitting Diagnosis ED Disposition    ED Disposition  Admit   Condition  --   Comment  H[facility] [[id number]]  Level of Care: Med-Surg [16]  Covid Evaluation: Asymptomatic Screening Protocol (No Symptoms)  Diagnosis: Cellulitis and abscess of left leg [[id number]][[id number]]  Admitting Physician: Lonia MadAGBATA, TOCHUKWU [[id number]]  Attending Physician: Lonia MadAGBATA, TOCHUKWU [[id number]]  Estimated length of stay: past midnight tomorrow  Certification:: I certify this patient will need inpatient services for at least 2 midnights         B Medical/Surgery History Past Medical History:  Diagnosis Date  . Hypertension    Past Surgical History:  Procedure Laterality Date  . CHOLECYSTECTOMY    . HERNIA REPAIR    . 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 ROTATOR CUFF REPAIR Right   . VARICOSE VEIN SURGERY Left    4 surgery     A IV Location/Drains/Wounds Patient Lines/Drains/Airways Status    Active Line/Drains/Airways    Name Placement date Placement time Site Days   Peripheral IV 03/25/21 22 G Left Antecubital 03/25/21  1101  Antecubital  less than 1   Peripheral IV 03/25/21 20 G Right Hand 03/25/21  1128  Hand  less than 1   Peripheral IV 03/25/21 20  G Right Forearm 03/25/21  1332  Forearm  less than 1          Intake/Output Last 24 hours  Intake/Output Summary (Last 24 hours) at 03/25/2021 2041 Last data filed at 03/25/2021 1717 Gross per 24 hour  Intake 21666.67 ml  Output --  Net 21666.67 ml    Labs/Imaging Results for orders placed or performed during the hospital encounter of 03/25/21 (from the past 48 hour(s))  Resp Panel by RT-PCR (Flu A&B, Covid) Nasopharyngeal Swab     Status: None   Collection Time: 03/25/21 11:18 AM   Specimen: Nasopharyngeal Swab; Nasopharyngeal(NP) swabs in vial transport medium  Result Value Ref Range   SARS Coronavirus 2 by RT PCR NEGATIVE NEGATIVE    Comment: (NOTE) SARS-CoV-2 target nucleic acids are NOT DETECTED.  The SARS-CoV-2 RNA is generally detectable in upper respiratory specimens during the acute phase of infection. The lowest concentration of SARS-CoV-2 viral copies this assay can detect is 138 copies/mL. A negative result does not preclude SARS-Cov-2 infection and should not be used as the sole basis for treatment or other patient management decisions. A negative result may occur with  improper specimen collection/handling, submission of specimen other than nasopharyngeal swab, presence of viral mutation(s) within the areas targeted by this assay, and inadequate number of viral copies(<138 copies/mL). A negative result must be combined with clinical observations, patient history, and epidemiological information. The expected result is Negative.  Fact  Sheet for Patients:  BloggerCourse.com  Fact Sheet for Healthcare Providers:  SeriousBroker.it  This test is no t yet approved or cleared by the [place] FDA and  has been authorized for detection and/or diagnosis of SARS-CoV-2 by FDA under an Emergency Use Authorization (EUA). This EUA will remain  in effect (meaning this test can be used) for the duration of the COVID-19  declaration under Section 564(b)(1) of the Act, 21 U.S.C.section 360bbb-3(b)(1), unless the authorization is terminated  or revoked sooner.       Influenza A by PCR NEGATIVE NEGATIVE   Influenza B by PCR NEGATIVE NEGATIVE    Comment: (NOTE) The Xpert Xpress SARS-CoV-2/FLU/RSV plus assay is intended as an aid in the diagnosis of influenza from Nasopharyngeal swab specimens and should not be used as a sole basis for treatment. Nasal washings and aspirates are unacceptable for Xpert Xpress SARS-CoV-2/FLU/RSV testing.  Fact Sheet for Patients: BloggerCourse.com  Fact Sheet for Healthcare Providers: SeriousBroker.it  This test is not yet approved or cleared by the [place] FDA and has been authorized for detection and/or diagnosis of SARS-CoV-2 by FDA under an Emergency Use Authorization (EUA). This EUA will remain in effect (meaning this test can be used) for the duration of the COVID-19 declaration under Section 564(b)(1) of the Act, 21 U.S.C. section 360bbb-3(b)(1), unless the authorization is terminated or revoked.  Performed at [facility], [address], [place], [place] [address]   Lactic acid, plasma     Status: None   Collection Time: 03/25/21 11:25 AM  Result Value Ref Range   Lactic Acid, Venous 1.6 0.5 - 1.9 mmol/L    Comment: Performed at [facility], [address] [place], [place] [address]  Comprehensive metabolic panel     Status: Abnormal   Collection Time: 03/25/21 11:25 AM  Result Value Ref Range   Sodium 138 135 - 145 mmol/L   Potassium 3.8 3.5 - 5.1 mmol/L   Chloride 105 98 - 111 mmol/L   CO2 26 22 - 32 mmol/L   Glucose, Bld 102 (H) 70 - 99 mg/dL    Comment: Glucose reference range applies only to samples taken after fasting for at least 8 hours.   BUN 22 8 - 23 mg/dL   Creatinine, Ser 7.37 0.61 - 1.24 mg/dL   Calcium 9.0 8.9 - 10.6 mg/dL   Total Protein 7.3 6.5 - 8.1 g/dL   Albumin 4.1 3.5 - 5.0  g/dL   AST 19 15 - 41 U/L   ALT 23 0 - 44 U/L   Alkaline Phosphatase 59 38 - 126 U/L   Total Bilirubin 1.1 0.3 - 1.2 mg/dL   GFR, Estimated >26 >94 mL/min    Comment: (NOTE) Calculated using the CKD-EPI Creatinine Equation (2021)    Anion gap 7 5 - 15    Comment: Performed at [facility], [address], [place], [place] [address]  CBC WITH DIFFERENTIAL     Status: Abnormal   Collection Time: 03/25/21 11:25 AM  Result Value Ref Range   WBC 10.3 4.0 - 10.5 K/uL   RBC 4.14 (L) 4.22 - 5.81 MIL/uL   Hemoglobin 14.0 13.0 - 17.0 g/dL   HCT 70.3 50.0 - 93.8 %   MCV 99.8 80.0 - 100.0 fL   MCH 33.8 26.0 - 34.0 pg   MCHC 33.9 30.0 - 36.0 g/dL   RDW 18.2 99.3 - 71.6 %   Platelets 191 150 - 400 K/uL   nRBC 0.0 0.0 - 0.2 %  Neutrophils Relative % 91 %   Neutro Abs 9.5 (H) 1.7 - 7.7 K/uL   Lymphocytes Relative 6 %   Lymphs Abs 0.7 0.7 - 4.0 K/uL   Monocytes Relative 1 %   Monocytes Absolute 0.1 0.1 - 1.0 K/uL   Eosinophils Relative 1 %   Eosinophils Absolute 0.1 0.0 - 0.5 K/uL   Basophils Relative 0 %   Basophils Absolute 0.0 0.0 - 0.1 K/uL   Immature Granulocytes 1 %   Abs Immature Granulocytes 0.07 0.00 - 0.07 K/uL    Comment: Performed at [facility], [address], [place], [place] [address]  Protime-INR     Status: None   Collection Time: 03/25/21 11:25 AM  Result Value Ref Range   Prothrombin Time 13.8 11.4 - 15.2 seconds   INR 1.1 0.8 - 1.2    Comment: (NOTE) INR goal varies based on device and disease states. Performed at [facility], [address], [place], [place] [address]   APTT     Status: Abnormal   Collection Time: 03/25/21 11:25 AM  Result Value Ref Range   aPTT 22 (L) 24 - 36 seconds    Comment: Performed at [facility], [address], [place], [place] [address]  Blood Culture (routine x 2)     Status: None (Preliminary result)   Collection Time: 03/25/21 11:25 AM   Specimen: Right Antecubital; Blood  Result Value Ref Range   Specimen Description       RIGHT ANTECUBITAL BOTTLES DRAWN AEROBIC AND ANAEROBIC   Special Requests      Blood Culture adequate volume Performed at [facility], [address], [place], [place] [address]    Culture PENDING    Report Status PENDING   Blood Culture (routine x 2)     Status: None (Preliminary result)   Collection Time: 03/25/21 11:25 AM   Specimen: BLOOD RIGHT HAND  Result Value Ref Range   Specimen Description      BLOOD RIGHT HAND BOTTLES DRAWN AEROBIC AND ANAEROBIC   Special Requests      Blood Culture adequate volume Performed at [facility], [address] [place], [place] [address]    Culture PENDING    Report Status PENDING   D-dimer, quantitative     Status: Abnormal   Collection Time: 03/25/21 11:25 AM  Result Value Ref Range   D-Dimer, Quant 1.71 (H) 0.00 - 0.50 ug/mL-FEU    Comment: (NOTE) At the manufacturer cut-off value of 0.5 g/mL FEU, this assay has a negative predictive value of 95-100%.This assay is intended for use in conjunction with a clinical pretest probability (PTP) assessment model to exclude pulmonary embolism (PE) and deep venous thrombosis (DVT) in outpatients suspected of PE or DVT. Results should be correlated with clinical presentation. Performed at [facility], [address] [place], [place] [address]   Lactic acid, plasma     Status: None   Collection Time: 03/25/21  2:16 PM  Result Value Ref Range   Lactic Acid, Venous 1.7 0.5 - 1.9 mmol/L    Comment: Performed at [facility], [address] [place], [place] [address]  Urinalysis, Routine w reflex microscopic Urine, Clean Catch     Status: Abnormal   Collection Time: 03/25/21  2:45 PM  Result Value Ref Range   Color, Urine STRAW (A) YELLOW   APPearance CLEAR CLEAR   Specific Gravity, Urine 1.025 1.005 - 1.030   pH 8.0 5.0 - 8.0   Glucose, UA NEGATIVE NEGATIVE mg/dL  Hgb urine dipstick NEGATIVE NEGATIVE   Bilirubin Urine NEGATIVE NEGATIVE   Ketones, ur NEGATIVE NEGATIVE mg/dL   Protein, ur  NEGATIVE NEGATIVE mg/dL   Nitrite NEGATIVE NEGATIVE   Leukocytes,Ua NEGATIVE NEGATIVE    Comment: Performed at [facility], [address] [place], [place] [address]   CT Angio Chest PE W and/or Wo Contrast  Result Date: 03/25/2021 CLINICAL DATA:  60 year old male with cough, fever and elevated D-dimer with left leg redness and swelling. EXAM: CT ANGIOGRAPHY CHEST WITH CONTRAST TECHNIQUE: Multidetector CT imaging of the chest was performed using the standard protocol during bolus administration of intravenous contrast. Multiplanar CT image reconstructions and MIPs were obtained to evaluate the vascular anatomy. CONTRAST:  OMNIPAQUE IOHEXOL 350 MG/ML SOLN COMPARISON:  None. FINDINGS: Cardiovascular: Satisfactory opacification of the pulmonary arteries to the segmental level. No evidence of pulmonary embolism. Normal heart size. No pericardial effusion. Mild atherosclerotic calcifications along the thoracic aorta. Mediastinum/Nodes: No enlarged mediastinal, hilar, or axillary lymph nodes. Thyroid gland, trachea, and esophagus demonstrate no significant findings. Lungs/Pleura: Lungs are clear. No pleural effusion or pneumothorax. Upper Abdomen: No acute abnormality. Surgical changes of prior cholecystectomy. Musculoskeletal: No chest wall abnormality. No acute or significant osseous findings. Review of the MIP images confirms the above findings. IMPRESSION: Negative for acute pulmonary embolus, pneumonia or other acute cardiopulmonary process. Aortic Atherosclerosis (ICD10-170.0) Electronically Signed   By: Malachy Moan M.D.   On: 03/25/2021 14:28   US Venous Img Lower  Left (DVT Study)  Result Date: 03/25/2021 CLINICAL DATA:  60 year old male with a history of left lower extremity pain EXAM: LEFT LOWER EXTREMITY VENOUS DOPPLER ULTRASOUND TECHNIQUE: Gray-scale sonography with graded compression, as well as color Doppler and duplex ultrasound were performed to evaluate the lower extremity deep  venous systems from the level of the common femoral vein and including the common femoral, femoral, profunda femoral, popliteal and calf veins including the posterior tibial, peroneal and gastrocnemius veins when visible. The superficial great saphenous vein was also interrogated. Spectral Doppler was utilized to evaluate flow at rest and with distal augmentation maneuvers in the common femoral, femoral and popliteal veins. COMPARISON:  None. FINDINGS: Contralateral Common Femoral Vein: Respiratory phasicity is normal and symmetric with the symptomatic side. No evidence of thrombus. Normal compressibility. Common Femoral Vein: No evidence of thrombus. Normal compressibility, respiratory phasicity and response to augmentation. Saphenofemoral Junction: No evidence of thrombus. Normal compressibility and flow on color Doppler imaging. Profunda Femoral Vein: No evidence of thrombus. Normal compressibility and flow on color Doppler imaging. Femoral Vein: No evidence of thrombus. Normal compressibility, respiratory phasicity and response to augmentation. Popliteal Vein: No evidence of thrombus. Normal compressibility, respiratory phasicity and response to augmentation. Calf Veins: No evidence of thrombus. Normal compressibility and flow on color Doppler imaging. Superficial Great Saphenous Vein: No evidence of thrombus. Normal compressibility and flow on color Doppler imaging. Other Findings: Dilated varicose veins of the left leg. Superficial edema. IMPRESSION: Sonographic survey of the left lower extremity negative for DVT. 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 Left lower extremity edema, nonspecific Electronically Signed   By: Gilmer Mor D.O.   On: 03/25/2021 13:47   DG Chest Port 1 View  Result Date: 03/25/2021 CLINICAL DATA:  Questionable sepsis - evaluate for abnormality EXAM: PORTABLE CHEST 1 VIEW COMPARISON:  None. FINDINGS: Mild enlargement  the cardiac silhouette. Central pulmonary vascular congestion. Mildly prominent lung markings without consolidation. No visible pleural effusions or pneumothorax on this  single AP radiograph. IMPRESSION: Mild cardiomegaly and central pulmonary vascular congestion without overt pulmonary edema. No consolidation. Electronically Signed   By: Feliberto Harts MD   On: 03/25/2021 12:12    Pending Labs Unresulted Labs (From admission, onward)    Start     Ordered   04/01/21 0500  Creatinine, serum  (enoxaparin (LOVENOX)    CrCl >/= 30 ml/min)  Weekly,   R     Comments: while on enoxaparin therapy    03/25/21 1605   03/26/21 0500  CBC  Tomorrow morning,   R        03/25/21 1605   03/26/21 0500  Basic metabolic panel  Tomorrow morning,   R        03/25/21 1605   03/25/21 1605  HIV Antibody (routine testing w rflx)  (HIV Antibody (Routine testing w reflex) panel)  Once,   STAT        03/25/21 1605   03/25/21 1121  Urine Culture  (Septic presentation on arrival (screening labs, nursing and treatment orders for obvious sepsis))  ONCE - STAT,   STAT       Question:  Indication  Answer:  Sepsis   03/25/21 1122          Vitals/Pain Today's Vitals   03/25/21 1700 03/25/21 1730 03/25/21 1800 03/25/21 1825  BP: 113/71 117/67 117/70   Pulse: 92 81 79   Resp: 16 (!) 23 (!) 21   Temp:    99.5 F (37.5 C)  TempSrc:    Oral  SpO2: 98% 96% 98%   Weight:      Height:      PainSc:        Isolation Precautions Airborne and Contact precautions  Medications Medications  lactated ringers infusion ( Intravenous New Bag/Given 03/25/21 1149)  cefTRIAXone (ROCEPHIN) 2 g in sodium chloride 0.9 % 100 mL IVPB (0 g Intravenous Stopped 03/25/21 1227)  famotidine (PEPCID) tablet 20 mg (20 mg Oral Given 03/25/21 1748)  ascorbic acid (VITAMIN C) tablet 500 mg (500 mg Oral Given 03/25/21 1748)  loratadine (CLARITIN) tablet 10 mg (10 mg Oral Given 03/25/21 1748)  enoxaparin (LOVENOX) injection 55 mg (has no  administration in time range)  sodium chloride flush (NS) 0.9 % injection 3 mL (has no administration in time range)  sodium chloride flush (NS) 0.9 % injection 3 mL (has no administration in time range)  0.9 %  sodium chloride infusion (has no administration in time range)  acetaminophen (TYLENOL) tablet 650 mg (has no administration in time range)    Or  acetaminophen (TYLENOL) suppository 650 mg (has no administration in time range)  ondansetron (ZOFRAN) tablet 4 mg (has no administration in time range)    Or  ondansetron (ZOFRAN) injection 4 mg (has no administration in time range)  lisinopril (ZESTRIL) tablet 10 mg (10 mg Oral Given 03/25/21 1748)    And  hydrochlorothiazide (MICROZIDE) capsule 12.5 mg (12.5 mg Oral Given 03/25/21 1748)  lactated ringers bolus 1,000 mL (0 mLs Intravenous Stopped 03/25/21 1333)    And  lactated ringers bolus 1,000 mL (0 mLs Intravenous Stopped 03/25/21 1448)    And  lactated ringers bolus 1,000 mL (0 mLs Intravenous Stopped 03/25/21 1717)    And  lactated ringers bolus 500 mL (0 mLs Intravenous Stopped 03/25/21 1448)  acetaminophen (TYLENOL) tablet 1,000 mg (1,000 mg Oral Given 03/25/21 1222)  iohexol (OMNIPAQUE) 350 MG/ML injection 100 mL (100 mLs Intravenous Contrast Given 03/25/21 1349)  Mobility walks Low fall risk   Focused Assessments    R Recommendations: See Admitting Provider Note  Report given to:   Additional Notes:

## 2021-03-25 NOTE — ED Provider Notes (Signed)
[facility] EMERGENCY DEPARTMENT Provider Note   CSN: [id number] Arrival date & time: 03/25/21  1046     History Chief Complaint  Patient presents with   Leg Swelling    Brandon Strickland is a 60 y.o. male.  HPI  Patient presents with left leg erythema and pain.  He had knee surgery in February, months later he developed cellulitis.  He finished his antibiotic course 3 weeks ago and the redness and swelling improved. He had no complaints until last night. Starting last night at 10 PM he started noticing redness, pain, edema again.  He is also febrile and having chills.  States he had a known COVID exposure earlier this week.  He also endorses feeling short of breath, although he denies any chest pain.  Has not tried any alleviating factors, ambulating makes the pain in his legs worse.  Also is having associated body aches.  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 No pertinent family history.  Social History   Tobacco Use   Smoking status: Former    Types: Cigarettes    Quit date: 01/03/2012    Years since quitting: 9.2   Smokeless tobacco: Never  Vaping Use   Vaping Use: Never used  Substance Use Topics   Alcohol use: Yes    Alcohol/week: 3.0 standard drinks    Types: 3 Cans of beer per week   Drug use: Never    Home Medications Prior to Admission medications   Medication Sig Start Date End Date Taking? Authorizing Provider  acetaminophen (TYLENOL) 500 MG tablet Take by mouth. 02/07/20   [provider]  ascorbic acid (VITAMIN C) 250 MG tablet Take by mouth.    [provider]  Cholecalciferol 25 MCG (1000 UT)  tablet Take by mouth.    [provider]  clobetasol ointment (TEMOVATE) 0.05 % Apply topically. 07/28/15   [provider]  DULoxetine (CYMBALTA) 30 MG capsule Take 1 capsule (30 mg total) by mouth daily. 02/04/21   Kirsteins, Victorino Sparrow, MD  famotidine (PEPCID) 20 MG tablet TAKE ONE TABLET BY MOUTH DAILY FOR HEARTBURN 01/12/20   [provider]  lisinopril-hydrochlorothiazide (ZESTORETIC) 10-12.5 MG tablet Take 1 tablet by mouth daily. 01/12/20   [provider]  loratadine (CLARITIN) 10 MG tablet Take by mouth.    [provider]  naproxen (NAPROSYN) 500 MG tablet TAKE ONE TABLET BY MOUTH TWICE A DAY AS NEEDED FOR PAIN (TAKE WITH FOOD) 01/22/21   [provider]    Allergies    Latex and Tape  Review of Systems   Review of Systems  Constitutional:  Positive for chills and fever.  HENT:  Negative for ear pain and sore throat.   Eyes:  Negative for pain and visual disturbance.  Respiratory:  Positive for cough and shortness of breath.   Cardiovascular:  Positive for leg swelling. Negative for chest pain and palpitations.  Gastrointestinal:  Negative for abdominal pain and vomiting.  Genitourinary:  Negative for dysuria and hematuria.  Musculoskeletal:  Positive for myalgias. Negative for arthralgias  and back pain.       Left leg pain  Skin:  Positive for color change. Negative for rash.  Neurological:  Negative for seizures and syncope.  All other systems reviewed and are negative.  Physical Exam Updated Vital Signs Ht 6\' 4"  (1.93 m)   Wt 113.4 kg   BMI 30.43 kg/m   Physical Exam Vitals and nursing note reviewed. Exam conducted with a chaperone present.  Constitutional:      Appearance: Normal appearance.     Comments: Uncomfortable appearing  HENT:     Head: Normocephalic and atraumatic.  Eyes:     General: No scleral icterus.       Right eye: No discharge.        Left eye: No discharge.     Extraocular Movements:  Extraocular movements intact.     Pupils: Pupils are equal, round, and reactive to light.  Cardiovascular:     Rate and Rhythm: Regular rhythm. Tachycardia present.     Pulses: Normal pulses.     Heart sounds: Normal heart sounds. No murmur heard.   No friction rub. No gallop.     Comments: DP and PT are 1+ bilaterally. Pulmonary:     Effort: Pulmonary effort is normal. No respiratory distress.     Breath sounds: Normal breath sounds.     Comments: Lungs clear to auscultation bilaterally Abdominal:     General: Abdomen is flat. Bowel sounds are normal. There is no distension.     Palpations: Abdomen is soft.     Tenderness: There is no abdominal tenderness.  Musculoskeletal:        General: Swelling and tenderness present.  Skin:    General: Skin is warm and dry.     Capillary Refill: Capillary refill takes less than 2 seconds.     Coloration: Skin is not jaundiced.     Findings: Erythema present.     Comments: Left leg has edema, erythema, and warmth to the touch.  Not grossly larger than the right leg, but there is some mild difference in size.  Neurological:     Mental Status: He is alert. Mental status is at baseline.     Coordination: Coordination normal.    ED Results / Procedures / Treatments   Labs (all labs ordered are listed, but only abnormal results are displayed) Labs Reviewed  RESP PANEL BY RT-PCR (FLU A&B, COVID) [id number]  CULTURE, BLOOD (ROUTINE X 2)  CULTURE, BLOOD (ROUTINE X 2)  URINE CULTURE  LACTIC ACID, PLASMA  LACTIC ACID, PLASMA  COMPREHENSIVE METABOLIC PANEL  CBC WITH DIFFERENTIAL/PLATELET  PROTIME-INR  APTT  URINALYSIS, ROUTINE W REFLEX MICROSCOPIC  D-DIMER, QUANTITATIVE    EKG None  Radiology No results found.  Procedures Procedures   Medications Ordered in ED Medications  lactated ringers infusion (has no administration in time range)  cefTRIAXone (ROCEPHIN) 2 g in sodium chloride 0.9 % 100 mL IVPB (has no administration in time  range)  lactated ringers bolus 1,000 mL (has no administration in time range)    And  lactated ringers bolus 1,000 mL (has no administration in time range)    And  lactated ringers bolus 1,000 mL (has no administration in time range)    And  lactated ringers bolus 500 mL (has no administration in time range)    ED Course  I have reviewed the triage vital signs and the nursing notes.  Pertinent labs & imaging results that were available during my care of the patient  were reviewed by me and considered in my medical decision making (see chart for details).  Clinical Course as of 03/25/21 1600  Fri Mar 25, 2021  1503 CBC WITH DIFFERENTIAL(!) No leukocytosis, no anemia [HS]  1503 Comprehensive metabolic panel(!) No electrolyte derangement no anion gap [HS]  1503 Resp Panel by RT-PCR (Flu A&B, Covid) Nasopharyngeal Swab COVID-negative [HS]  1503 D-dimer, quantitative(!) Elevated. This in combination with his risk factors for PE, tachycardia, SOB and negative DVT could be consistent with possible PE. Tachycardia could be secondary to fever, but I will order CTA to rule out PE.  [HS]  1505 Urinalysis, Routine w reflex microscopic Urine, Clean Catch(!) No UTI [HS]    Clinical Course User Index [HS] Theron Arista, PA-C   MDM Rules/Calculators/A&P                           Patient is tachycardic and febrile on initial intake, will initiate lactic work-up.  We will also evaluate for possible DVT in the left limb.  Broad differential including cellulitis, DVT, COVID, pneumonia, sepsis.   No signs of PE or DVT.  I suspect his symptoms are all secondary to cellulitis causing sepsis.  Will speak with the hospitalist about admitting the patient for IV antibiotics and observation.  Dr. Joylene Igo to accept patient for admission.   Final Clinical Impression(s) / ED Diagnoses Final diagnoses:  None    Rx / DC Orders ED Discharge Orders     None        Theron Arista, PA-C 03/25/21 1600     Bethann Berkshire, MD 03/28/21 [phone]

## 2021-03-26 ENCOUNTER — Inpatient Hospital Stay (HOSPITAL_COMMUNITY): Payer: No Typology Code available for payment source

## 2021-03-26 DIAGNOSIS — B9620 Unspecified Escherichia coli [E. coli] as the cause of diseases classified elsewhere: Secondary | ICD-10-CM

## 2021-03-26 DIAGNOSIS — A4151 Sepsis due to Escherichia coli [E. coli]: Principal | ICD-10-CM | POA: Diagnosis present

## 2021-03-26 DIAGNOSIS — A419 Sepsis, unspecified organism: Secondary | ICD-10-CM

## 2021-03-26 DIAGNOSIS — L02416 Cutaneous abscess of left lower limb: Secondary | ICD-10-CM

## 2021-03-26 DIAGNOSIS — L02612 Cutaneous abscess of left foot: Secondary | ICD-10-CM | POA: Diagnosis present

## 2021-03-26 DIAGNOSIS — R7881 Bacteremia: Secondary | ICD-10-CM

## 2021-03-26 DIAGNOSIS — I10 Essential (primary) hypertension: Secondary | ICD-10-CM

## 2021-03-26 DIAGNOSIS — L03116 Cellulitis of left lower limb: Secondary | ICD-10-CM

## 2021-03-26 DIAGNOSIS — I872 Venous insufficiency (chronic) (peripheral): Secondary | ICD-10-CM

## 2021-03-26 LAB — CBC
HCT: 37.7 % — ABNORMAL LOW (ref 39.0–52.0)
Hemoglobin: 12.3 g/dL — ABNORMAL LOW (ref 13.0–17.0)
MCH: 33.5 pg (ref 26.0–34.0)
MCHC: 32.6 g/dL (ref 30.0–36.0)
MCV: 102.7 fL — ABNORMAL HIGH (ref 80.0–100.0)
Platelets: 172 10*3/uL (ref 150–400)
RBC: 3.67 MIL/uL — ABNORMAL LOW (ref 4.22–5.81)
RDW: 13.6 % (ref 11.5–15.5)
WBC: 16.4 10*3/uL — ABNORMAL HIGH (ref 4.0–10.5)
nRBC: 0 % (ref 0.0–0.2)

## 2021-03-26 LAB — BLOOD CULTURE ID PANEL (REFLEXED) - BCID2

## 2021-03-26 LAB — BASIC METABOLIC PANEL
Anion gap: 7 (ref 5–15)
BUN: 16 mg/dL (ref 8–23)
CO2: 28 mmol/L (ref 22–32)
Calcium: 8.9 mg/dL (ref 8.9–10.3)
Chloride: 104 mmol/L (ref 98–111)
Creatinine, Ser: 0.75 mg/dL (ref 0.61–1.24)
GFR, Estimated: >60 mL/min (ref >60)
Glucose, Bld: 115 mg/dL — ABNORMAL HIGH (ref 70–99)
Potassium: 3.6 mmol/L (ref 3.5–5.1)
Sodium: 139 mmol/L (ref 135–145)

## 2021-03-26 MED ORDER — MUPIROCIN CALCIUM 2 % EX CREA
TOPICAL_CREAM | Freq: Two times a day (BID) | CUTANEOUS | Status: DC
  Filled 2021-03-26: qty 15

## 2021-03-26 NOTE — Clinical Social Work Note (Signed)
[facility] notification done [phone]

## 2021-03-26 NOTE — Progress Notes (Signed)
PHARMACY - PHYSICIAN COMMUNICATION CRITICAL VALUE ALERT - BLOOD CULTURE IDENTIFICATION (BCID)  Brandon Strickland is an 60 y.o. male who presented to [facility] on 03/25/2021   Assessment:  BCID with e. Coli- no resistance   Current antibiotics: Ceftriaxone 2000 mg IV every 24 hours  Changes to prescribed antibiotics recommended:  Patient is on recommended antibiotics - No changes needed  Results for orders placed or performed during the hospital encounter of 03/25/21  Blood Culture ID Panel (Reflexed) (Collected: 03/25/2021 11:25 AM)  Result Value Ref Range   Enterococcus faecalis NOT DETECTED NOT DETECTED   Enterococcus Faecium NOT DETECTED NOT DETECTED   Listeria monocytogenes NOT DETECTED NOT DETECTED   Staphylococcus species NOT DETECTED NOT DETECTED   Staphylococcus aureus (BCID) NOT DETECTED NOT DETECTED   Staphylococcus epidermidis NOT DETECTED NOT DETECTED   Staphylococcus lugdunensis NOT DETECTED NOT DETECTED   Streptococcus species NOT DETECTED NOT DETECTED   Streptococcus agalactiae NOT DETECTED NOT DETECTED   Streptococcus pneumoniae NOT DETECTED NOT DETECTED   Streptococcus pyogenes NOT DETECTED NOT DETECTED   A.calcoaceticus-baumannii NOT DETECTED NOT DETECTED   Bacteroides fragilis NOT DETECTED NOT DETECTED   Enterobacterales DETECTED (A) NOT DETECTED   Enterobacter cloacae complex NOT DETECTED NOT DETECTED   Escherichia coli DETECTED (A) NOT DETECTED   Klebsiella aerogenes NOT DETECTED NOT DETECTED   Klebsiella oxytoca NOT DETECTED NOT DETECTED   Klebsiella pneumoniae NOT DETECTED NOT DETECTED   Proteus species NOT DETECTED NOT DETECTED   Salmonella species NOT DETECTED NOT DETECTED   Serratia marcescens NOT DETECTED NOT DETECTED   Haemophilus influenzae NOT DETECTED NOT DETECTED   Neisseria meningitidis NOT DETECTED NOT DETECTED   Pseudomonas aeruginosa NOT DETECTED NOT DETECTED   Stenotrophomonas maltophilia NOT DETECTED NOT DETECTED   Candida albicans NOT  DETECTED NOT DETECTED   Candida auris NOT DETECTED NOT DETECTED   Candida glabrata NOT DETECTED NOT DETECTED   Candida krusei NOT DETECTED NOT DETECTED   Candida parapsilosis NOT DETECTED NOT DETECTED   Candida tropicalis NOT DETECTED NOT DETECTED   Cryptococcus neoformans/gattii NOT DETECTED NOT DETECTED   CTX-M ESBL NOT DETECTED NOT DETECTED   Carbapenem resistance IMP NOT DETECTED NOT DETECTED   Carbapenem resistance KPC NOT DETECTED NOT DETECTED   Carbapenem resistance NDM NOT DETECTED NOT DETECTED   Carbapenem resist OXA 48 LIKE NOT DETECTED NOT DETECTED   Carbapenem resistance VIM NOT DETECTED NOT DETECTED    Tad Moore 03/26/2021  8:46 AM

## 2021-03-26 NOTE — Consult Note (Signed)
WOC Nurse Consult Note: Reason for Consult: Patient with wound at distal tip of second digit on left foot Wound type:trauma vs PAD  Pressure Injury POA: N/A Measurement:1cm x 0.6cm x 0.1cm Wound BMS:XJDB, moist Drainage (amount, consistency, odor) small serosanguinous Periwound:intact Dressing procedure/placement/frequency: Dried serum (scab) removed by MD this morning, orders provided for twice daily application of mupirocin ointment topped with a saline dampened dressing, dry dressing and secured.  WOC nursing team will not follow, but will remain available to this patient, the nursing and medical teams.  Please re-consult if needed. Thanks, Ladona Mow, MSN, RN, GNP, [facility]  Pager# [phone]

## 2021-03-26 NOTE — Progress Notes (Signed)
Patient Demographics:    Brandon Strickland, is a 60 y.o. male, DOB - July 26, 1961, TRZ:[id number]  Admit date - 03/25/2021   Admitting Physician Collier Bullock, MD  Outpatient Primary MD for the patient is Sheela Stack, MD  LOS - 1   Chief Complaint  Patient presents with   Leg Swelling        Subjective:    Brandon Strickland today has no further fevers, no emesis,  No chest pain,   -Fevers and chills appear to be resolving -Left lower extremity swelling and discomfort is not worse  Assessment  & Plan :    Principal Problem:   Sepsis due to Escherichia coli (E. coli) /-E. 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 coli bacteremia and sepsis as well as left lower extremity cellulitis and left second toe infection with a need to rule out osteomyelitis  A/p 1) E. coli Bacteremia and Sepsis ---?? 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 -Continue IV Rocephin  3)HTN--stable, continue lisinopril /HCTZ  4)Peripheral Neuropathy--- continue Cymbalta  5)GERD- continue Pepcid  6) left second toe infection--- please  see photos in epic, x-rays inconclusive consider repeat x-rays after removing dressings/bandages -Antibiotics as above #1  Disposition/Need for in-Hospital Stay- patient unable to be discharged at this time due to E coli Sepsis and Bacteremia-- requiring iv Antibiotic pending further culture data  Status is: Inpatient  Remains inpatient appropriate because: E. coli bacteremia sepsis requiring IV antibiotics  Disposition: The patient is from: Home              Anticipated d/c is to: Home              Anticipated d/c date is: 2 days              Patient currently is not medically stable to d/c. Barriers: Not Clinically Stable-   Code Status :  -  Code Status: Full Code   Family Communication:    NA (patient is alert, awake and coherent)   Consults  :  Na  DVT Prophylaxis  :   - SCDs       Lab Results  Component Value Date   PLT 172 03/26/2021    Inpatient Medications  Scheduled Meds:  ascorbic acid  500 mg Oral Daily   enoxaparin (LOVENOX) injection  55 mg Subcutaneous Q24H   famotidine  20 mg Oral Daily   lisinopril  10 mg Oral Daily   And   hydrochlorothiazide  12.5 mg  Oral Daily   loratadine  10 mg Oral Daily   sodium chloride flush  3 mL Intravenous Q12H   Continuous Infusions:  sodium chloride     cefTRIAXone (ROCEPHIN)  IV 2 g (03/26/21 1157)   PRN Meds:.sodium chloride, acetaminophen **OR** acetaminophen, ondansetron **OR** ondansetron (ZOFRAN) IV, sodium chloride flush    Anti-infectives (From admission, onward)    Start     Dose/Rate Route Frequency Ordered Stop   03/25/21 1615  cefTRIAXone (ROCEPHIN) 2 g in sodium chloride 0.9 % 100 mL IVPB  Status:  Discontinued        2 g 200 mL/hr over 30 Minutes Intravenous Every 24 hours 03/25/21 1605 03/25/21 1606   03/25/21 1130  cefTRIAXone (ROCEPHIN) 2 g in sodium chloride 0.9 % 100 mL IVPB        2 g 200 mL/hr over 30 Minutes Intravenous Every 24 hours 03/25/21 1122           Objective:   Vitals:    03/25/21 2146 03/26/21 0100 03/26/21 0500 03/26/21 1011  BP: 117/71 118/70 120/74 117/60  Pulse: 76 81 80 71  Resp: $Remo'18 18 18 19  'pFcuo$ Temp: 98.4 F (36.9 C) 98.3 F (36.8 C) 98.4 F (36.9 C) 98.9 F (37.2 C)  TempSrc: Oral Oral Oral Oral  SpO2: 99% 99% 98% 100%  Weight: 113.9 kg     Height: $Remove'6\' 4"'iUuKmNj$  (1.93 m)       Wt Readings from Last 3 Encounters:  03/25/21 113.9 kg  03/22/21 114.2 kg  02/21/21 113.4 kg    Intake/Output Summary (Last 24 hours) at 03/26/2021 1310 Last data filed at 03/26/2021 0900 Gross per 24 hour  Intake 22106.67 ml  Output --  Net 22106.67 ml   Physical Exam  Gen:- Awake Alert,  in no apparent distress  HEENT:- [place].AT, No sclera icterus Neck-Supple Neck,No JVD,.  Lungs-  CTAB , fair symmetrical air movement CV- S1, S2 normal, regular  Abd-  +ve B.Sounds, Abd Soft, No tenderness,    Extremity/Skin:-   pedal pulses present , left leg erythema warmth swelling and tenderness Psych-affect is appropriate, oriented x3 Neuro-no new focal deficits, no tremors Lt Foot--- please see photos below and in epic Media Information         Document Information  Photos  Lt 2nd Toe  03/26/2021 09:24  Attached To:  Hospital Encounter on 03/25/21   Source Information  Rhea Kaelin, MD  Ap-Dept 300     Data Review:   Micro Results Recent Results (from the past 240 hour(s))  Resp Panel by RT-PCR (Flu A&B, Covid) Nasopharyngeal Swab     Status: None   Collection Time: 03/25/21 11:18 AM   Specimen: Nasopharyngeal Swab; Nasopharyngeal(NP) swabs in vial transport medium  Result Value Ref Range Status   SARS Coronavirus 2 by RT PCR NEGATIVE NEGATIVE Final    Comment: (NOTE) SARS-CoV-2 target nucleic acids are NOT DETECTED.  The SARS-CoV-2 RNA is generally detectable in upper respiratory specimens during the acute phase of infection. The lowest concentration of SARS-CoV-2 viral copies this assay can detect is 138 copies/mL. A negative result does not  preclude SARS-Cov-2 infection and should not be used as the sole basis for treatment or other patient management decisions. A negative result may occur with  improper specimen collection/handling, submission of specimen other than nasopharyngeal swab, presence of viral mutation(s) within the areas targeted by this assay, and inadequate number of viral copies(<138 copies/mL). A negative result must be combined with clinical observations, patient  history, and epidemiological information. The expected result is Negative.  Fact Sheet for Patients:  EntrepreneurPulse.com.au  Fact Sheet for Healthcare Providers:  IncredibleEmployment.be  This test is no t yet approved or cleared by the [place] FDA and  has been authorized for detection and/or diagnosis of SARS-CoV-2 by FDA under an Emergency Use Authorization (EUA). This EUA will remain  in effect (meaning this test can be used) for the duration of the COVID-19 declaration under Section 564(b)(1) of the Act, 21 U.S.C.section 360bbb-3(b)(1), unless the authorization is terminated  or revoked sooner.       Influenza A by PCR NEGATIVE NEGATIVE Final   Influenza B by PCR NEGATIVE NEGATIVE Final    Comment: (NOTE) The Xpert Xpress SARS-CoV-2/FLU/RSV plus assay is intended as an aid in the diagnosis of influenza from Nasopharyngeal swab specimens and should not be used as a sole basis for treatment. Nasal washings and aspirates are unacceptable for Xpert Xpress SARS-CoV-2/FLU/RSV testing.  Fact Sheet for Patients: EntrepreneurPulse.com.au  Fact Sheet for Healthcare Providers: IncredibleEmployment.be  This test is not yet approved or cleared by the [place] FDA and has been authorized for detection and/or diagnosis of SARS-CoV-2 by FDA under an Emergency Use Authorization (EUA). This EUA will remain in effect (meaning this test can be used) for the  duration of the COVID-19 declaration under Section 564(b)(1) of the Act, 21 U.S.C. section 360bbb-3(b)(1), unless the authorization is terminated or revoked.  Performed at [facility], [address], [place], [place] [address]   Blood Culture (routine x 2)     Status: None (Preliminary result)   Collection Time: 03/25/21 11:25 AM   Specimen: Right Antecubital; Blood  Result Value Ref Range Status   Specimen Description   Final    RIGHT ANTECUBITAL BOTTLES DRAWN AEROBIC AND ANAEROBIC Performed at [facility], [address], [place], [place] [address]    Special Requests   Final    Blood Culture adequate volume Performed at [facility], [address], [place], [place] [address]    Culture  Setup Time   Final    GRAM NEGATIVE RODS BLOOD ANAEROBIC BOTTLE Gram Stain Report Called to,Read Back By and Verified With: FARRELL,S @ 1044 03/25/21 BY STEPHTR CRITICAL RESULT CALLED TO, READ BACK BY AND VERIFIED WITH: S,HURTH PHARMD $RemoveBefore'@0824'vwOPHpbAnfYsl$  03/26/21 EB Performed at [facility], [place] [address], [place], [place] [address]    Culture GRAM NEGATIVE RODS  Final   Report Status PENDING  Incomplete  Blood Culture (routine x 2)     Status: None (Preliminary result)   Collection Time: 03/25/21 11:25 AM   Specimen: BLOOD RIGHT HAND  Result Value Ref Range Status   Specimen Description   Final    BLOOD RIGHT HAND BOTTLES DRAWN AEROBIC AND ANAEROBIC Performed at [facility], [address], [place], [place] [address]    Special Requests   Final    Blood Culture adequate volume Performed at [facility], [address] [place], [place] [address]    Culture  Setup Time   Final    GRAM NEGATIVE RODS BLOOD ANAEROBIC BOTTLE Gram Stain Report Called to,Read Back By and Verified With: FARRELL,S @ 1601 03/25/21 BY STEPHTR Performed at [facility], [address], [place],  [address]    Culture GRAM NEGATIVE RODS  Final   Report Status PENDING  Incomplete  Blood Culture ID Panel (Reflexed)      Status: Abnormal   Collection Time: 03/25/21 11:25 AM  Result Value Ref  Range Status   Enterococcus faecalis NOT DETECTED NOT DETECTED Final   Enterococcus Faecium NOT DETECTED NOT DETECTED Final   Listeria monocytogenes NOT DETECTED NOT DETECTED Final   Staphylococcus species NOT DETECTED NOT DETECTED Final   Staphylococcus aureus (BCID) NOT DETECTED NOT DETECTED Final   Staphylococcus epidermidis NOT DETECTED NOT DETECTED Final   Staphylococcus lugdunensis NOT DETECTED NOT DETECTED Final   Streptococcus species NOT DETECTED NOT DETECTED Final   Streptococcus agalactiae NOT DETECTED NOT DETECTED Final   Streptococcus pneumoniae NOT DETECTED NOT DETECTED Final   Streptococcus pyogenes NOT DETECTED NOT DETECTED Final   A.calcoaceticus-baumannii NOT DETECTED NOT DETECTED Final   Bacteroides fragilis NOT DETECTED NOT DETECTED Final   Enterobacterales DETECTED (A) NOT DETECTED Final    Comment: Enterobacterales represent a large order of gram negative bacteria, not a single organism. CRITICAL RESULT CALLED TO, READ BACK BY AND VERIFIED WITH: S,HURTH PHARMD $RemoveBefore'@0824'pfSKWyijwOWRL$  03/26/21 EB    Enterobacter cloacae complex NOT DETECTED NOT DETECTED Final   Escherichia coli DETECTED (A) NOT DETECTED Final    Comment: CRITICAL RESULT CALLED TO, READ BACK BY AND VERIFIED WITH: S,HURTH PHARMD $RemoveBefore'@0824'njVbLYmtunxmU$  03/26/21 EB    Klebsiella aerogenes NOT DETECTED NOT DETECTED Final   Klebsiella oxytoca NOT DETECTED NOT DETECTED Final   Klebsiella pneumoniae NOT DETECTED NOT DETECTED Final   Proteus species NOT DETECTED NOT DETECTED Final   Salmonella species NOT DETECTED NOT DETECTED Final   Serratia marcescens NOT DETECTED NOT DETECTED Final   Haemophilus influenzae NOT DETECTED NOT DETECTED Final   Neisseria meningitidis NOT DETECTED NOT DETECTED Final   Pseudomonas aeruginosa NOT DETECTED NOT DETECTED Final   Stenotrophomonas maltophilia NOT DETECTED NOT DETECTED Final   Candida albicans NOT DETECTED NOT DETECTED Final    Candida auris NOT DETECTED NOT DETECTED Final   Candida glabrata NOT DETECTED NOT DETECTED Final   Candida krusei NOT DETECTED NOT DETECTED Final   Candida parapsilosis NOT DETECTED NOT DETECTED Final   Candida tropicalis NOT DETECTED NOT DETECTED Final   Cryptococcus neoformans/gattii NOT DETECTED NOT DETECTED Final   CTX-M ESBL NOT DETECTED NOT DETECTED Final   Carbapenem resistance IMP NOT DETECTED NOT DETECTED Final   Carbapenem resistance KPC NOT DETECTED NOT DETECTED Final   Carbapenem resistance NDM NOT DETECTED NOT DETECTED Final   Carbapenem resist OXA 48 LIKE NOT DETECTED NOT DETECTED Final   Carbapenem resistance VIM NOT DETECTED NOT DETECTED Final    Comment: Performed at [facility], [address] [place], [place] [address]    Radiology Reports CT Angio Chest PE W and/or Wo Contrast  Result Date: 03/25/2021 CLINICAL DATA:  60 year old male with cough, fever and elevated D-dimer with left leg redness and swelling. EXAM: CT ANGIOGRAPHY CHEST WITH CONTRAST TECHNIQUE: Multidetector CT imaging of the chest was performed using the standard protocol during bolus administration of intravenous contrast. Multiplanar CT image reconstructions and MIPs were obtained to evaluate the vascular anatomy. CONTRAST:  165mL OMNIPAQUE IOHEXOL 350 MG/ML SOLN COMPARISON:  None. FINDINGS: Cardiovascular: Satisfactory opacification of the pulmonary arteries to the segmental level. No evidence of pulmonary embolism. Normal heart size. No pericardial effusion. Mild atherosclerotic calcifications along the thoracic aorta. Mediastinum/Nodes: No enlarged mediastinal, hilar, or axillary lymph nodes. Thyroid gland, trachea, and esophagus demonstrate no significant findings. Lungs/Pleura: Lungs are clear. No pleural effusion or pneumothorax. Upper Abdomen: No acute abnormality. Surgical changes of prior cholecystectomy. Musculoskeletal: No chest wall abnormality. No acute or significant osseous findings.  Review of the MIP images confirms the  above findings. IMPRESSION: Negative for acute pulmonary embolus, pneumonia or other acute cardiopulmonary process. Aortic Atherosclerosis (ICD10-170.0) Electronically Signed   By: Jacqulynn Cadet M.D.   On: 03/25/2021 14:28   US Venous Img Lower  Left (DVT Study)  Result Date: 03/25/2021 CLINICAL DATA:  60 year old male with a history of left lower extremity pain EXAM: LEFT LOWER EXTREMITY VENOUS DOPPLER ULTRASOUND TECHNIQUE: Gray-scale sonography with graded compression, as well as color Doppler and duplex ultrasound were performed to evaluate the lower extremity deep venous systems from the level of the common femoral vein and including the common femoral, femoral, profunda femoral, popliteal and calf veins including the posterior tibial, peroneal and gastrocnemius veins when visible. The superficial great saphenous vein was also interrogated. Spectral Doppler was utilized to evaluate flow at rest and with distal augmentation maneuvers in the common femoral, femoral and popliteal veins. COMPARISON:  None. FINDINGS: Contralateral Common Femoral Vein: Respiratory phasicity is normal and symmetric with the symptomatic side. No evidence of thrombus. Normal compressibility. Common Femoral Vein: No evidence of thrombus. Normal compressibility, respiratory phasicity and response to augmentation. Saphenofemoral Junction: No evidence of thrombus. Normal compressibility and flow on color Doppler imaging. Profunda Femoral Vein: No evidence of thrombus. Normal compressibility and flow on color Doppler imaging. Femoral Vein: No evidence of thrombus. Normal compressibility, respiratory phasicity and response to augmentation. Popliteal Vein: No evidence of thrombus. Normal compressibility, respiratory phasicity and response to augmentation. Calf Veins: No evidence of thrombus. Normal compressibility and flow on color Doppler imaging. Superficial Great Saphenous Vein: No evidence of  thrombus. Normal compressibility and flow on color Doppler imaging. Other Findings: Dilated varicose veins of the left leg. Superficial edema. IMPRESSION: Sonographic survey of the left lower extremity negative for DVT. 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 Left lower extremity edema, nonspecific Electronically Signed   By: Corrie Mckusick D.O.   On: 03/25/2021 13:47   DG Chest Port 1 View  Result Date: 03/25/2021 CLINICAL DATA:  Questionable sepsis - evaluate for abnormality EXAM: PORTABLE CHEST 1 VIEW COMPARISON:  None. FINDINGS: Mild enlargement the cardiac silhouette. Central pulmonary vascular congestion. Mildly prominent lung markings without consolidation. No visible pleural effusions or pneumothorax on this single AP radiograph. IMPRESSION: Mild cardiomegaly and central pulmonary vascular congestion without overt pulmonary edema. No consolidation. Electronically Signed   By: Margaretha Sheffield MD   On: 03/25/2021 12:12   DG Foot 2 Views Left  Result Date: 03/26/2021 CLINICAL DATA:  Swollen infected foot. Suspicion of infection of second toe. EXAM: LEFT FOOT - 2 VIEW COMPARISON:  10/08/2020 FINDINGS: Multiple hammertoe deformities. Mild soft tissue swelling about the midfoot. Small Achilles and calcaneal spurs. bandaging overlies the second toe. No gross osseous destruction. Overlap of digits on the lateral view. Oblique imaging not performed. IMPRESSION: Multifactorial degradation, including bandaging overlying the second digit, lack of oblique imaging. No gross osseous destruction to confirm osteomyelitis. Mild diffuse soft tissue swelling. Consider repeat three-view exam after removal of bandage material. Electronically Signed   By: Abigail Miyamoto M.D.   On: 03/26/2021 12:57     CBC Recent Labs  Lab 03/25/21 1125 03/26/21 0431  WBC 10.3 16.4*  HGB 14.0 12.3*  HCT 41.3 37.7*  PLT 191 172  MCV 99.8 102.7*  MCH 33.8 33.5  MCHC  33.9 32.6  RDW 13.2 13.6  LYMPHSABS 0.7  --   MONOABS 0.1  --   EOSABS 0.1  --   BASOSABS 0.0  --  Chemistries  Recent Labs  Lab 03/25/21 1125 03/26/21 0431  NA 138 139  K 3.8 3.6  CL 105 104  CO2 26 28  GLUCOSE 102* 115*  BUN 22 16  CREATININE 0.86 0.75  CALCIUM 9.0 8.9  AST 19  --   ALT 23  --   ALKPHOS 59  --   BILITOT 1.1  --    ------------------------------------------------------------------------------------------------------------------ No results for input(s): CHOL, HDL, LDLCALC, TRIG, CHOLHDL, LDLDIRECT in the last 72 hours.  No results found for: HGBA1C ------------------------------------------------------------------------------------------------------------------ No results for input(s): TSH, T4TOTAL, T3FREE, THYROIDAB in the last 72 hours.  Invalid input(s): FREET3 ------------------------------------------------------------------------------------------------------------------ No results for input(s): VITAMINB12, FOLATE, FERRITIN, TIBC, IRON, RETICCTPCT in the last 72 hours.  Coagulation profile Recent Labs  Lab 03/25/21 1125  INR 1.1    Recent Labs    03/25/21 1125  DDIMER 1.71*    Cardiac Enzymes No results for input(s): CKMB, TROPONINI, MYOGLOBIN in the last 168 hours.  Invalid input(s): CK ------------------------------------------------------------------------------------------------------------------ No results found for: BNP   Roxan Hockey M.D on 03/26/2021 at 1:10 PM  Go to [email or web address] - for contact info  [facility] - Office  [phone]

## 2021-03-27 LAB — URINE CULTURE: Culture: NO GROWTH

## 2021-03-27 MED ORDER — OXYCODONE HCL 5 MG PO TABS
5.0000 mg | ORAL_TABLET | Freq: Four times a day (QID) | ORAL | Status: DC | PRN
  Administered 2021-03-27 – 2021-03-28 (×2): 5 mg via ORAL
  Filled 2021-03-27 (×3): qty 1

## 2021-03-27 NOTE — Progress Notes (Signed)
Patient Demographics:    Brandon Strickland, is a 60 y.o. male, DOB - Oct 05, 1960, PRF:[id number]  Admit date - 03/25/2021   Admitting Physician Collier Bullock, MD  Outpatient Primary MD for the patient is Sheela Stack, MD  LOS - 2   Chief Complaint  Patient presents with   Leg Swelling        Subjective:    Brandon Strickland today has no further fevers, no emesis,  No chest pain,   -Fevers and chills appear to be resolving -Left lower extremity swelling and discomfort is not worse  Assessment  & Plan :    Principal Problem:   Sepsis due to Escherichia coli (E. coli) /-E. 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 coli bacteremia and sepsis as well as left lower extremity cellulitis and left second toe infection with a need to rule out osteomyelitis  A/p 1) E. coli Bacteremia and Sepsis ---?? Source, not usually typical organism for cellulitis -Patient met sepsis criteria on admission with temp of 103.2, heart rate of 117, respiratory rate of 31, WBC is up to 16.4 -Continue IV Rocephin pending further culture data -Blood cultures from 03/25/21 with E. 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 -Continue IV Rocephin  3)HTN--stable, continue lisinopril /HCTZ  4)Peripheral Neuropathy--- continue Cymbalta  5)GERD- continue Pepcid  6) left second toe infection--- please see photos in epic, x-rays inconclusive consider repeat x-rays after removing dressings/bandages -Wound care recommendations appreciated -Antibiotics as above #1  Disposition/Need for in-Hospital Stay- patient unable to be discharged at this time due to E coli Sepsis and Bacteremia-- requiring iv Antibiotic pending further culture data  Status is: Inpatient  Remains inpatient appropriate because: E. coli bacteremia sepsis requiring IV antibiotics  Disposition: The patient is from: Home              Anticipated d/c is to: Home              Anticipated d/c date is: 2 days              Patient currently is not medically stable to d/c. Barriers: Not Clinically Stable-   Code Status :  -  Code Status: Full Code   Family Communication:    NA (patient is alert, awake and coherent)   Consults  :  Na  DVT Prophylaxis  :   - SCDs       Lab Results  Component Value Date   PLT 172 03/26/2021    Inpatient Medications  Scheduled Meds:  ascorbic acid  500 mg  Oral Daily   enoxaparin (LOVENOX) injection  55 mg Subcutaneous Q24H   famotidine  20 mg Oral Daily   lisinopril  10 mg Oral Daily   And   hydrochlorothiazide  12.5 mg Oral Daily   loratadine  10 mg Oral Daily   mupirocin cream   Topical BID   sodium chloride flush  3 mL Intravenous Q12H   Continuous Infusions:  sodium chloride     cefTRIAXone (ROCEPHIN)  IV 2 g (03/27/21 1027)   PRN Meds:.sodium chloride, acetaminophen **OR** acetaminophen, ondansetron **OR** ondansetron (ZOFRAN) IV, oxyCODONE, sodium chloride flush    Anti-infectives (From admission, onward)    Start     Dose/Rate Route Frequency Ordered Stop   03/25/21 1615  cefTRIAXone (ROCEPHIN) 2 g in sodium chloride 0.9 % 100 mL IVPB  Status:   Discontinued        2 g 200 mL/hr over 30 Minutes Intravenous Every 24 hours 03/25/21 1605 03/25/21 1606   03/25/21 1130  cefTRIAXone (ROCEPHIN) 2 g in sodium chloride 0.9 % 100 mL IVPB        2 g 200 mL/hr over 30 Minutes Intravenous Every 24 hours 03/25/21 1122           Objective:   Vitals:   03/26/21 1419 03/26/21 2052 03/27/21 0541 03/27/21 1349  BP: 128/68 133/72 128/74 134/67  Pulse: 71 83 79 77  Resp: $Remo'19 18 18 20  'JYzTf$ Temp: 98.4 F (36.9 C) 99.9 F (37.7 C) 98.6 F (37 C) 98.7 F (37.1 C)  TempSrc: Oral Oral Oral Oral  SpO2: 100% 100% 96% 97%  Weight:      Height:        Wt Readings from Last 3 Encounters:  03/25/21 113.9 kg  03/22/21 114.2 kg  02/21/21 113.4 kg    Intake/Output Summary (Last 24 hours) at 03/27/2021 1606 Last data filed at 03/27/2021 1300 Gross per 24 hour  Intake 723 ml  Output --  Net 723 ml   Physical Exam  Gen:- Awake Alert,  in no apparent distress  HEENT:- [place].AT, No sclera icterus Neck-Supple Neck,No JVD,.  Lungs-  CTAB , fair symmetrical air movement CV- S1, S2 normal, regular  Abd-  +ve B.Sounds, Abd Soft, No tenderness,    Extremity/Skin:-   pedal pulses present , left leg erythema warmth swelling and tenderness, left second toe open wound with no significant drainage at this time Psych-affect is appropriate, oriented x3 Neuro-no new focal deficits, no tremors Lt Foot--- please see photos  in epic     Data Review:   Micro Results Recent Results (from the past 240 hour(s))  Resp Panel by RT-PCR (Flu A&B, Covid) Nasopharyngeal Swab     Status: None   Collection Time: 03/25/21 11:18 AM   Specimen: Nasopharyngeal Swab; Nasopharyngeal(NP) swabs in vial transport medium  Result Value Ref Range Status   SARS Coronavirus 2 by RT PCR NEGATIVE NEGATIVE Final    Comment: (NOTE) SARS-CoV-2 target nucleic acids are NOT DETECTED.  The SARS-CoV-2 RNA is generally detectable in upper respiratory specimens during the acute phase of  infection. The lowest concentration of SARS-CoV-2 viral copies this assay can detect is 138 copies/mL. A negative result does not preclude SARS-Cov-2 infection and should not be used as the sole basis for treatment or other patient management decisions. A negative result may occur with  improper specimen collection/handling, submission of specimen other than nasopharyngeal swab, presence of viral mutation(s) within the areas targeted by this assay, and inadequate number of  viral copies(<138 copies/mL). A negative result must be combined with clinical observations, patient history, and epidemiological information. The expected result is Negative.  Fact Sheet for Patients:  EntrepreneurPulse.com.au  Fact Sheet for Healthcare Providers:  IncredibleEmployment.be  This test is no t yet approved or cleared by the [place] FDA and  has been authorized for detection and/or diagnosis of SARS-CoV-2 by FDA under an Emergency Use Authorization (EUA). This EUA will remain  in effect (meaning this test can be used) for the duration of the COVID-19 declaration under Section 564(b)(1) of the Act, 21 U.S.C.section 360bbb-3(b)(1), unless the authorization is terminated  or revoked sooner.       Influenza A by PCR NEGATIVE NEGATIVE Final   Influenza B by PCR NEGATIVE NEGATIVE Final    Comment: (NOTE) The Xpert Xpress SARS-CoV-2/FLU/RSV plus assay is intended as an aid in the diagnosis of influenza from Nasopharyngeal swab specimens and should not be used as a sole basis for treatment. Nasal washings and aspirates are unacceptable for Xpert Xpress SARS-CoV-2/FLU/RSV testing.  Fact Sheet for Patients: EntrepreneurPulse.com.au  Fact Sheet for Healthcare Providers: IncredibleEmployment.be  This test is not yet approved or cleared by the [place] FDA and has been authorized for detection and/or diagnosis of SARS-CoV-2  by FDA under an Emergency Use Authorization (EUA). This EUA will remain in effect (meaning this test can be used) for the duration of the COVID-19 declaration under Section 564(b)(1) of the Act, 21 U.S.C. section 360bbb-3(b)(1), unless the authorization is terminated or revoked.  Performed at [facility], [address], [place], [place] [address]   Blood Culture (routine x 2)     Status: Abnormal (Preliminary result)   Collection Time: 03/25/21 11:25 AM   Specimen: Right Antecubital; Blood  Result Value Ref Range Status   Specimen Description   Final    RIGHT ANTECUBITAL BOTTLES DRAWN AEROBIC AND ANAEROBIC Performed at [facility], [address], [place], [place] [address]    Special Requests   Final    Blood Culture adequate volume Performed at [facility], [address], [place], [place] [address]    Culture  Setup Time   Final    GRAM NEGATIVE RODS BLOOD ANAEROBIC BOTTLE Gram Stain Report Called to,Read Back By and Verified With: FARRELL,S @ 6759 03/25/21 BY STEPHTR CRITICAL RESULT CALLED TO, READ BACK BY AND VERIFIED WITH: S,HURTH PHARMD $RemoveBefore'@0824'[id number]$  03/26/21 EB    Culture (A)  Final    ESCHERICHIA COLI SUSCEPTIBILITIES TO FOLLOW Performed at [facility], [address], [place], [place] [address]    Report Status PENDING  Incomplete  Blood Culture (routine x 2)     Status: None (Preliminary result)   Collection Time: 03/25/21 11:25 AM   Specimen: BLOOD RIGHT HAND  Result Value Ref Range Status   Specimen Description   Final    BLOOD RIGHT HAND BOTTLES DRAWN AEROBIC AND ANAEROBIC Performed at [facility], [address] [place], [place] [address]    Special Requests   Final    Blood Culture adequate volume Performed at [facility], [address] [place],  [address]    Culture  Setup Time   Final    GRAM NEGATIVE RODS BLOOD ANAEROBIC BOTTLE Gram Stain Report Called to,Read Back By and Verified With: FARRELL,S @ 7793 03/25/21 BY STEPHTR Performed at [facility], [address], [place],  [address]    Culture   Final    Lonell Grandchild NEGATIVE RODS IDENTIFICATION TO FOLLOW Performed at [facility], [place] [address], [place], [place] [address]    Report Status PENDING  Incomplete  Blood Culture ID Panel (Reflexed)     Status: Abnormal   Collection Time: 03/25/21 11:25 AM  Result Value Ref Range Status   Enterococcus faecalis NOT DETECTED NOT DETECTED Final   Enterococcus Faecium NOT DETECTED NOT DETECTED Final   Listeria monocytogenes NOT DETECTED NOT DETECTED Final   Staphylococcus species NOT DETECTED NOT DETECTED Final   Staphylococcus aureus (BCID) NOT DETECTED NOT DETECTED Final   Staphylococcus epidermidis NOT DETECTED NOT DETECTED Final   Staphylococcus lugdunensis NOT DETECTED NOT DETECTED Final   Streptococcus species NOT DETECTED NOT DETECTED Final   Streptococcus agalactiae NOT DETECTED NOT DETECTED Final   Streptococcus pneumoniae NOT DETECTED NOT DETECTED Final   Streptococcus pyogenes NOT DETECTED NOT DETECTED Final   A.calcoaceticus-baumannii NOT DETECTED NOT DETECTED Final   Bacteroides fragilis NOT DETECTED NOT DETECTED Final   Enterobacterales DETECTED (A) NOT DETECTED Final    Comment: Enterobacterales represent a large order of gram negative bacteria, not a single organism. CRITICAL RESULT CALLED TO, READ BACK BY AND VERIFIED WITH: S,HURTH PHARMD $RemoveBefore'@0824'[id number]$  03/26/21 EB    Enterobacter cloacae complex NOT DETECTED NOT DETECTED Final   Escherichia coli DETECTED (A) NOT DETECTED Final    Comment: CRITICAL RESULT CALLED TO, READ BACK BY AND VERIFIED WITH: S,HURTH PHARMD $RemoveBefore'@0824'[id number]$  03/26/21 EB    Klebsiella aerogenes NOT DETECTED NOT DETECTED Final   Klebsiella oxytoca NOT DETECTED NOT DETECTED Final   Klebsiella pneumoniae NOT DETECTED NOT DETECTED Final   Proteus species NOT DETECTED NOT DETECTED Final   Salmonella species NOT DETECTED NOT DETECTED Final   Serratia marcescens NOT DETECTED NOT DETECTED Final   Haemophilus  influenzae NOT DETECTED NOT DETECTED Final   Neisseria meningitidis NOT DETECTED NOT DETECTED Final   Pseudomonas aeruginosa NOT DETECTED NOT DETECTED Final   Stenotrophomonas maltophilia NOT DETECTED NOT DETECTED Final   Candida albicans NOT DETECTED NOT DETECTED Final   Candida auris NOT DETECTED NOT DETECTED Final   Candida glabrata NOT DETECTED NOT DETECTED Final   Candida krusei NOT DETECTED NOT DETECTED Final   Candida parapsilosis NOT DETECTED NOT DETECTED Final   Candida tropicalis NOT DETECTED NOT DETECTED Final   Cryptococcus neoformans/gattii NOT DETECTED NOT DETECTED Final   CTX-M ESBL NOT DETECTED NOT DETECTED Final   Carbapenem resistance IMP NOT DETECTED NOT DETECTED Final   Carbapenem resistance KPC NOT DETECTED NOT DETECTED Final   Carbapenem resistance NDM NOT DETECTED NOT DETECTED Final   Carbapenem resist OXA 48 LIKE NOT DETECTED NOT DETECTED Final   Carbapenem resistance VIM NOT DETECTED NOT DETECTED Final    Comment: Performed at [facility], [address] [place], [place] [address]  Urine Culture     Status: None   Collection Time: 03/25/21  2:45 PM   Specimen: In/Out Cath Urine  Result Value Ref Range Status   Specimen Description   Final    IN/OUT CATH URINE Performed at [facility], [address], [place], [place] [address]    Special Requests   Final    NONE Performed at [facility], [address] [place], [place] [address]    Culture   Final    NO GROWTH Performed at [facility], [place] [address] [place], [place] [address]    Report Status 03/27/2021 FINAL  Final  Aerobic/Anaerobic Culture w Gram Stain (surgical/deep wound)     Status: None (Preliminary result)  Collection Time: 03/26/21  9:28 AM   Specimen: Foot; Wound  Result Value Ref Range Status   Specimen Description   Final    FOOT Performed at [facility], [address], [place], [place] [address]    Special Requests   Final    LEFT Performed at [facility],  [address], [place], [place] [address]    Gram Stain   Final    RARE WBC PRESENT, PREDOMINANTLY PMN FEW GRAM POSITIVE COCCI IN PAIRS RARE GRAM NEGATIVE RODS    Culture   Final    CULTURE REINCUBATED FOR BETTER GROWTH Performed at [facility], [place] [address] [place], [place] [address]    Report Status PENDING  Incomplete    Radiology Reports CT Angio Chest PE W and/or Wo Contrast  Result Date: 03/25/2021 CLINICAL DATA:  60 year old male with cough, fever and elevated D-dimer with left leg redness and swelling. EXAM: CT ANGIOGRAPHY CHEST WITH CONTRAST TECHNIQUE: Multidetector CT imaging of the chest was performed using the standard protocol during bolus administration of intravenous contrast. Multiplanar CT image reconstructions and MIPs were obtained to evaluate the vascular anatomy. CONTRAST:  154mL OMNIPAQUE IOHEXOL 350 MG/ML SOLN COMPARISON:  None. FINDINGS: Cardiovascular: Satisfactory opacification of the pulmonary arteries to the segmental level. No evidence of pulmonary embolism. Normal heart size. No pericardial effusion. Mild atherosclerotic calcifications along the thoracic aorta. Mediastinum/Nodes: No enlarged mediastinal, hilar, or axillary lymph nodes. Thyroid gland, trachea, and esophagus demonstrate no significant findings. Lungs/Pleura: Lungs are clear. No pleural effusion or pneumothorax. Upper Abdomen: No acute abnormality. Surgical changes of prior cholecystectomy. Musculoskeletal: No chest wall abnormality. No acute or significant osseous findings. Review of the MIP images confirms the above findings. IMPRESSION: Negative for acute pulmonary embolus, pneumonia or other acute cardiopulmonary process. Aortic Atherosclerosis (ICD10-170.0) Electronically Signed   By: Jacqulynn Cadet M.D.   On: 03/25/2021 14:28   US Venous Img Lower  Left (DVT Study)  Result Date: 03/25/2021 CLINICAL DATA:  60 year old male with a history of left lower extremity pain EXAM: LEFT LOWER  EXTREMITY VENOUS DOPPLER ULTRASOUND TECHNIQUE: Gray-scale sonography with graded compression, as well as color Doppler and duplex ultrasound were performed to evaluate the lower extremity deep venous systems from the level of the common femoral vein and including the common femoral, femoral, profunda femoral, popliteal and calf veins including the posterior tibial, peroneal and gastrocnemius veins when visible. The superficial great saphenous vein was also interrogated. Spectral Doppler was utilized to evaluate flow at rest and with distal augmentation maneuvers in the common femoral, femoral and popliteal veins. COMPARISON:  None. FINDINGS: Contralateral Common Femoral Vein: Respiratory phasicity is normal and symmetric with the symptomatic side. No evidence of thrombus. Normal compressibility. Common Femoral Vein: No evidence of thrombus. Normal compressibility, respiratory phasicity and response to augmentation. Saphenofemoral Junction: No evidence of thrombus. Normal compressibility and flow on color Doppler imaging. Profunda Femoral Vein: No evidence of thrombus. Normal compressibility and flow on color Doppler imaging. Femoral Vein: No evidence of thrombus. Normal compressibility, respiratory phasicity and response to augmentation. Popliteal Vein: No evidence of thrombus. Normal compressibility, respiratory phasicity and response to augmentation. Calf Veins: No evidence of thrombus. Normal compressibility and flow on color Doppler imaging. Superficial Great Saphenous Vein: No evidence of thrombus. Normal compressibility and flow on color Doppler imaging. Other Findings: Dilated varicose veins of the left leg. Superficial edema. IMPRESSION: Sonographic survey of the left lower extremity negative for DVT. Dilated varicose veins of the left lower extremity, potentially  related to chronic venous insufficiency. Left lower extremity edema, nonspecific Electronically Signed   By: Corrie Mckusick D.O.   On: 03/25/2021  13:47   DG Chest Port 1 View  Result Date: 03/25/2021 CLINICAL DATA:  Questionable sepsis - evaluate for abnormality EXAM: PORTABLE CHEST 1 VIEW COMPARISON:  None. FINDINGS: Mild enlargement the cardiac silhouette. Central pulmonary vascular congestion. Mildly prominent lung markings without consolidation. No visible pleural effusions or pneumothorax on this single AP radiograph. IMPRESSION: Mild cardiomegaly and central pulmonary vascular congestion without overt pulmonary edema. No consolidation. Electronically Signed   By: Margaretha Sheffield MD   On: 03/25/2021 12:12   DG Foot 2 Views Left  Result Date: 03/26/2021 CLINICAL DATA:  Swollen infected foot. Suspicion of infection of second toe. EXAM: LEFT FOOT - 2 VIEW COMPARISON:  10/08/2020 FINDINGS: Multiple hammertoe deformities. Mild soft tissue swelling about the midfoot. Small Achilles and calcaneal spurs. bandaging overlies the second toe. No gross osseous destruction. Overlap of digits on the lateral view. Oblique imaging not performed. IMPRESSION: Multifactorial degradation, including bandaging overlying the second digit, lack of oblique imaging. No gross osseous destruction to confirm osteomyelitis. Mild diffuse soft tissue swelling. Consider repeat three-view exam after removal of bandage material. Electronically Signed   By: Abigail Miyamoto M.D.   On: 03/26/2021 12:57     CBC Recent Labs  Lab 03/25/21 1125 03/26/21 0431  WBC 10.3 16.4*  HGB 14.0 12.3*  HCT 41.3 37.7*  PLT 191 172  MCV 99.8 102.7*  MCH 33.8 33.5  MCHC 33.9 32.6  RDW 13.2 13.6  LYMPHSABS 0.7  --   MONOABS 0.1  --   EOSABS 0.1  --   BASOSABS 0.0  --     Chemistries  Recent Labs  Lab 03/25/21 1125 03/26/21 0431  NA 138 139  K 3.8 3.6  CL 105 104  CO2 26 28  GLUCOSE 102* 115*  BUN 22 16  CREATININE 0.86 0.75  CALCIUM 9.0 8.9  AST 19  --   ALT 23  --   ALKPHOS 59  --   BILITOT 1.1  --     ------------------------------------------------------------------------------------------------------------------ No results for input(s): CHOL, HDL, LDLCALC, TRIG, CHOLHDL, LDLDIRECT in the last 72 hours.  No results found for: HGBA1C ------------------------------------------------------------------------------------------------------------------ No results for input(s): TSH, T4TOTAL, T3FREE, THYROIDAB in the last 72 hours.  Invalid input(s): FREET3 ------------------------------------------------------------------------------------------------------------------ No results for input(s): VITAMINB12, FOLATE, FERRITIN, TIBC, IRON, RETICCTPCT in the last 72 hours.  Coagulation profile Recent Labs  Lab 03/25/21 1125  INR 1.1    Recent Labs    03/25/21 1125  DDIMER 1.71*    Cardiac Enzymes No results for input(s): CKMB, TROPONINI, MYOGLOBIN in the last 168 hours.  Invalid input(s): CK ------------------------------------------------------------------------------------------------------------------ No results found for: BNP   Roxan Hockey M.D on 03/27/2021 at 4:06 PM  Go to [email or web address] - for contact info  [facility] - Office  [phone]

## 2021-03-28 LAB — CULTURE, BLOOD (ROUTINE X 2)
Special Requests: ADEQUATE
Special Requests: ADEQUATE

## 2021-03-28 LAB — CBC
HCT: 40.7 % (ref 39.0–52.0)
Hemoglobin: 13.6 g/dL (ref 13.0–17.0)
MCH: 34 pg (ref 26.0–34.0)
MCHC: 33.4 g/dL (ref 30.0–36.0)
MCV: 101.8 fL — ABNORMAL HIGH (ref 80.0–100.0)
Platelets: 182 10*3/uL (ref 150–400)
RBC: 4 MIL/uL — ABNORMAL LOW (ref 4.22–5.81)
RDW: 13.1 % (ref 11.5–15.5)
WBC: 9.2 10*3/uL (ref 4.0–10.5)
nRBC: 0 % (ref 0.0–0.2)

## 2021-03-28 MED ORDER — SODIUM CHLORIDE 0.9 % IV SOLN
2.0000 g | Freq: Once | INTRAVENOUS | Status: AC
  Administered 2021-03-28: 2 g via INTRAVENOUS
  Filled 2021-03-28: qty 20

## 2021-03-28 MED ORDER — MUPIROCIN CALCIUM 2 % EX CREA: TOPICAL_CREAM | Freq: Two times a day (BID) | CUTANEOUS | 0 refills | Status: DC

## 2021-03-28 MED ORDER — DOXYCYCLINE HYCLATE 100 MG PO TABS: 100.0000 mg | ORAL_TABLET | Freq: Two times a day (BID) | ORAL | 0 refills | Status: AC

## 2021-03-28 MED ORDER — CEPHALEXIN 500 MG PO CAPS: 500.0000 mg | ORAL_CAPSULE | Freq: Three times a day (TID) | ORAL | 0 refills | Status: AC

## 2021-03-28 NOTE — Discharge Summary (Signed)
Brandon Strickland, is a 60 y.o. male  DOB 05-13-1961  MRN [id number].  Admission date:  03/25/2021  Admitting Physician  Collier Bullock, MD  Discharge Date:  03/28/2021   Primary MD  Sheela Stack, MD  Recommendations for primary care physician for things to follow:   1) please take doxycycline and cephalexin/Keflex antibiotic as prescribed for the next week--- avoid excessive sun exposure while taking doxycycline  2) please follow-up with your primary care physician Sheela Stack, MD --- within a week for recheck and reevaluation  3)Wound care to left foot, 2nd toe, distal tip:  Cleanse with saline , pat gently dry. Apply a thin layer of mupirocin to wound, cover with saline moistened gauze, cover with dry gauze and secure with tape.  Change twice daily and PRN dressing dislodgement.    Admission Diagnosis  Cellulitis of left lower extremity [L03.116] Cellulitis and abscess of left leg [L03.116, L02.416] Sepsis, due to unspecified organism, unspecified whether acute organ dysfunction present [facility]) [A41.9]   Discharge Diagnosis  Cellulitis of left lower extremity [L03.116] Cellulitis and abscess of left leg [L03.116, L02.416] Sepsis, due to unspecified organism, unspecified whether acute organ dysfunction present ([place]) [A41.9]    Principal Problem:   Sepsis due to Escherichia coli (E. coli) /-E. coli Bacteremia and Sepsis  Active Problems:   Cellulitis and abscess of left leg   E coli bacteremia   2nd  Toe abscess, left   Hypertension   Venous insufficiency of left lower extremity   Sepsis ([place])      Past Medical History:  Diagnosis Date   Hypertension     Past Surgical History:  Procedure Laterality Date   CHOLECYSTECTOMY     HERNIA REPAIR     REPLACEMENT TOTAL KNEE BILATERAL  05/2019   ROTATOR CUFF REPAIR Right    VARICOSE VEIN SURGERY Left    4 surgery       HPI  from the history  and physical done on the day of admission:    Chief Complaint: Left leg pain   HPI: Brandon Strickland is a 60 y.o. male with medical history significant for hypertension who presents to the emergency room for evaluation of pain involving his left leg which started on the day of admission.  He rates his pain a 6 x 10 in intensity at its worst.  Pain is nonradiating and is associated with redness, fever and chills.  Patient states that he recently completed antibiotics a month ago for cellulitis and also notes that he had a recent COVID exposure couple of days prior to his admission. He denies any recent trauma, no falls, no abdominal pain, no changes in his bowel habits, no urinary symptoms, no dizziness, no lightheadedness, no headache, no palpitations, no diaphoresis, no chest pain or shortness of breath. Labs show sodium 138, potassium 3.8, chloride 105, bicarb 26, glucose 102, BUN 22, creatinine 0.86, calcium 9.0, alkaline phosphatase 59, albumin 4.1, AST 19, ALT 23, total protein 7.3, lactic acid 1.6, white count 10.3, hemoglobin 14.0, hematocrit 41.3,  MCV 99.8, RDW 13.2, platelet count 191, D-dimer 1.79, PT 13.8, INR 1.1 Respiratory viral panel is negative Chest x-ray reviewed by me shows Mild cardiomegaly and central pulmonary vascular congestion without overt pulmonary edema. No consolidation. Left lower extremity venous Doppler is negative for DVT but shows dilated varicose veins of the left lower extremity related to chronic venous insufficiency. CT angiogram of the chest is negative for acute pulmonary embolism, pneumonia or acute cardiopulmonary process. Twelve-lead EKG reviewed by me shows sinus tachycardia with left anterior fascicular block and right bundle branch block     ED Course: Patient is a 60 year old male who presents to the ER for evaluation of pain in his left leg associated with redness, swelling and differential warmth as well as fever and chills.  He had a T-max of 103F upon  arrival to the ER and was tachycardic and tachypneic. Lactic acid level was within normal limits and he has no leukocytosis. He received 3.5 L fluid bolus as well as a dose of Rocephin 2 g IV x1 He will be admitted to the hospital for further evaluation.       Hospital Course:   Brief Summary:- 60 year old with a past medical history relevant for obesity, HTN and chronic left lower extremity postsurgical venous stasis/venous insufficiency-with peripheral neuropathy and history of GERD -admitted on 03/25/2021 with E. coli bacteremia and sepsis as well as left lower extremity cellulitis and left second toe infection    A/p 1) E. coli Bacteremia and Sepsis ---?? Source, not usually typical organism for cellulitis -Patient met sepsis criteria on admission with temp of 103.2, heart rate of 117, respiratory rate of 31, WBC is up to 16.4 -Patient was treated with IV Rocephin  -Blood cultures from 03/25/21 with E. coli -- mostly pansensitive, will discharge on p.o. 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 Antibiotic therapy as above #1   3)HTN--stable, continue lisinopril /HCTZ   4)Peripheral Neuropathy--- continue Cymbalta   5)GERD- continue Pepcid   6) left second toe infection--- please see photos in epic, x-rays without a definite evidence of osteomyelitis -Wound care recommendations appreciated -Antibiotics as above #1   Disposition---Home in stable condition    Disposition: The patient is from: Home              Anticipated d/c is to: Home                Code Status :  -  Code Status: Full Code    Family Communication:    NA (patient is alert, awake and coherent)  Discharge Condition:-Stable  Follow UP   Follow-up Information     Sheela Stack, MD. Schedule an appointment as soon as possible for a visit in 1 week(s).   Specialty: Family Medicine                Diet and Activity recommendation:  As advised  Discharge Instructions    Discharge Instructions     Call MD for:  difficulty breathing, headache or visual disturbances  Complete by: As directed    Call MD for:  persistant dizziness or light-headedness   Complete by: As directed    Call MD for:  persistant nausea and vomiting   Complete by: As directed    Call MD for:  severe uncontrolled pain   Complete by: As directed    Call MD for:  temperature >100.4   Complete by: As directed    Diet - low sodium heart healthy   Complete by: As directed    Discharge instructions   Complete by: As directed    1) please take doxycycline and cephalexin/Keflex antibiotic as prescribed for the next week--- avoid excessive sun exposure while taking doxycycline  2) please follow-up with your primary care physician Sheela Stack, MD --- within a week for recheck and reevaluation  3)Wound care to left foot, 2nd toe, distal tip:  Cleanse with saline , pat gently dry. Apply a thin layer of mupirocin to wound, cover with saline moistened gauze, cover with dry gauze and secure with tape.  Change twice daily and PRN dressing dislodgement.   Discharge wound care:   Complete by: As directed    Wound care to left foot, 2nd toe, distal tip:  Cleanse with NS, pat gently dry. Apply a thin layer of mupirocin to wound, top with saline moistened gauze, top with dry gauze and secure with tape.  Change twice daily and PRN dressing dislodgement.   Increase activity slowly   Complete by: As  directed        Discharge Medications     Allergies as of 03/28/2021       Reactions   Latex Rash   Other reaction(s): rash/itching   Tape         Medication List     STOP taking these medications    naproxen 500 MG tablet Commonly known as: NAPROSYN       TAKE these medications    acetaminophen 500 MG tablet Commonly known as: TYLENOL Take 1,000 mg by mouth every 4 (four) hours as needed for moderate pain.   ascorbic acid 250 MG tablet Commonly known as: VITAMIN C Take 250 mg by mouth daily.   cephALEXin 500 MG capsule Commonly known as: Keflex Take 1 capsule (500 mg total) by mouth 3 (three) times daily for 7 days.   Cholecalciferol 25 MCG (1000 UT) tablet Take 1,000 Units by mouth daily.   clobetasol ointment 0.05 % Commonly known as: TEMOVATE Apply topically.   doxycycline 100 MG tablet Commonly known as: VIBRA-TABS Take 1 tablet (100 mg total) by mouth 2 (two) times daily for 7 days.   famotidine 20 MG tablet Commonly known as: PEPCID Take 20 mg by mouth daily.   lisinopril-hydrochlorothiazide 10-12.5 MG tablet Commonly known as: ZESTORETIC Take 1 tablet by mouth daily.   loratadine 10 MG tablet Commonly known as: CLARITIN Take 10 mg by mouth daily.   mupirocin cream 2 % Commonly known as: BACTROBAN Apply topically 2 (two) times daily.       ASK your doctor about these medications    DULoxetine 30 MG capsule Commonly known as: CYMBALTA Take 1 capsule (30 mg total) by mouth daily.               Discharge Care Instructions  (From admission, onward)           Start     Ordered   03/28/21 0000  Discharge wound care:       Comments: Wound care to  left foot, 2nd toe, distal tip:  Cleanse with NS, pat gently dry. Apply a thin layer of mupirocin to wound, top with saline moistened gauze, top with dry gauze and secure with tape.  Change twice daily and PRN dressing dislodgement.   03/28/21 1156            Major  procedures and Radiology Reports - PLEASE review detailed and final reports for all details, in brief -   CT Angio Chest PE W and/or Wo Contrast  Result Date: 03/25/2021 CLINICAL DATA:  60 year old male with cough, fever and elevated D-dimer with left leg redness and swelling. EXAM: CT ANGIOGRAPHY CHEST WITH CONTRAST TECHNIQUE: Multidetector CT imaging of the chest was performed using the standard protocol during bolus administration of intravenous contrast. Multiplanar CT image reconstructions and MIPs were obtained to evaluate the vascular anatomy. CONTRAST:  136m OMNIPAQUE IOHEXOL 350 MG/ML SOLN COMPARISON:  None. FINDINGS: Cardiovascular: Satisfactory opacification of the pulmonary arteries to the segmental level. No evidence of pulmonary embolism. Normal heart size. No pericardial effusion. Mild atherosclerotic calcifications along the thoracic aorta. Mediastinum/Nodes: No enlarged mediastinal, hilar, or axillary lymph nodes. Thyroid gland, trachea, and esophagus demonstrate no significant findings. Lungs/Pleura: Lungs are clear. No pleural effusion or pneumothorax. Upper Abdomen: No acute abnormality. Surgical changes of prior cholecystectomy. Musculoskeletal: No chest wall abnormality. No acute or significant osseous findings. Review of the MIP images confirms the above findings. IMPRESSION: Negative for acute pulmonary embolus, pneumonia or other acute cardiopulmonary process. Aortic Atherosclerosis (ICD10-170.0) Electronically Signed   By: HJacqulynn CadetM.D.   On: 03/25/2021 14:28   [place]Venous Img Lower  Left (DVT Study)  Result Date: 03/25/2021 CLINICAL DATA:  60year old male with a history of left lower extremity pain EXAM: LEFT LOWER EXTREMITY VENOUS DOPPLER ULTRASOUND TECHNIQUE: Gray-scale sonography with graded compression, as well as color Doppler and duplex ultrasound were performed to evaluate the lower extremity deep venous systems from the level of the common femoral vein and  including the common femoral, femoral, profunda femoral, popliteal and calf veins including the posterior tibial, peroneal and gastrocnemius veins when visible. The superficial great saphenous vein was also interrogated. Spectral Doppler was utilized to evaluate flow at rest and with distal augmentation maneuvers in the common femoral, femoral and popliteal veins. COMPARISON:  None. FINDINGS: Contralateral Common Femoral Vein: Respiratory phasicity is normal and symmetric with the symptomatic side. No evidence of thrombus. Normal compressibility. Common Femoral Vein: No evidence of thrombus. Normal compressibility, respiratory phasicity and response to augmentation. Saphenofemoral Junction: No evidence of thrombus. Normal compressibility and flow on color Doppler imaging. Profunda Femoral Vein: No evidence of thrombus. Normal compressibility and flow on color Doppler imaging. Femoral Vein: No evidence of thrombus. Normal compressibility, respiratory phasicity and response to augmentation. Popliteal Vein: No evidence of thrombus. Normal compressibility, respiratory phasicity and response to augmentation. Calf Veins: No evidence of thrombus. Normal compressibility and flow on color Doppler imaging. Superficial Great Saphenous Vein: No evidence of thrombus. Normal compressibility and flow on color Doppler imaging. Other Findings: Dilated varicose veins of the left leg. Superficial edema. IMPRESSION: Sonographic survey of the left lower extremity negative for DVT. 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 Left lower extremity edema, nonspecific Electronically Signed   By: JCorrie MckusickD.O.   On: 03/25/2021 13:47   DG Chest Port 1 View  Result Date: 03/25/2021 CLINICAL DATA:  Questionable sepsis - evaluate for abnormality EXAM: PORTABLE CHEST 1 VIEW COMPARISON:  None.  FINDINGS: Mild enlargement the cardiac silhouette. Central pulmonary vascular  congestion. Mildly prominent lung markings without consolidation. No visible pleural effusions or pneumothorax on this single AP radiograph. IMPRESSION: Mild cardiomegaly and central pulmonary vascular congestion without overt pulmonary edema. No consolidation. Electronically Signed   By: Margaretha Sheffield MD   On: 03/25/2021 12:12   DG Foot 2 Views Left  Result Date: 03/26/2021 CLINICAL DATA:  Swollen infected foot. Suspicion of infection of second toe. EXAM: LEFT FOOT - 2 VIEW COMPARISON:  10/08/2020 FINDINGS: Multiple hammertoe deformities. Mild soft tissue swelling about the midfoot. Small Achilles and calcaneal spurs. bandaging overlies the second toe. No gross osseous destruction. Overlap of digits on the lateral view. Oblique imaging not performed. IMPRESSION: Multifactorial degradation, including bandaging overlying the second digit, lack of oblique imaging. No gross osseous destruction to confirm osteomyelitis. Mild diffuse soft tissue swelling. Consider repeat three-view exam after removal of bandage material. Electronically Signed   By: Abigail Miyamoto M.D.   On: 03/26/2021 12:57    Micro Results    Recent Results (from the past 240 hour(s))  Resp Panel by RT-PCR (Flu A&B, Covid) Nasopharyngeal Swab     Status: None   Collection Time: 03/25/21 11:18 AM   Specimen: Nasopharyngeal Swab; Nasopharyngeal(NP) swabs in vial transport medium  Result Value Ref Range Status   SARS Coronavirus 2 by RT PCR NEGATIVE NEGATIVE Final    Comment: (NOTE) SARS-CoV-2 target nucleic acids are NOT DETECTED.  The SARS-CoV-2 RNA is generally detectable in upper respiratory specimens during the acute phase of infection. The lowest concentration of SARS-CoV-2 viral copies this assay can detect is 138 copies/mL. A negative result does not preclude SARS-Cov-2 infection and should not be used as the sole basis for treatment or other patient management decisions. A negative result may occur with  improper specimen  collection/handling, submission of specimen other than nasopharyngeal swab, presence of viral mutation(s) within the areas targeted by this assay, and inadequate number of viral copies(<138 copies/mL). A negative result must be combined with clinical observations, patient history, and epidemiological information. The expected result is Negative.  Fact Sheet for Patients:  EntrepreneurPulse.com.au  Fact Sheet for Healthcare Providers:  IncredibleEmployment.be  This test is no t yet approved or cleared by the [place] FDA and  has been authorized for detection and/or diagnosis of SARS-CoV-2 by FDA under an Emergency Use Authorization (EUA). This EUA will remain  in effect (meaning this test can be used) for the duration of the COVID-19 declaration under Section 564(b)(1) of the Act, 21 U.S.C.section 360bbb-3(b)(1), unless the authorization is terminated  or revoked sooner.       Influenza A by PCR NEGATIVE NEGATIVE Final   Influenza B by PCR NEGATIVE NEGATIVE Final    Comment: (NOTE) The Xpert Xpress SARS-CoV-2/FLU/RSV plus assay is intended as an aid in the diagnosis of influenza from Nasopharyngeal swab specimens and should not be used as a sole basis for treatment. Nasal washings and aspirates are unacceptable for Xpert Xpress SARS-CoV-2/FLU/RSV testing.  Fact Sheet for Patients: EntrepreneurPulse.com.au  Fact Sheet for Healthcare Providers: IncredibleEmployment.be  This test is not yet approved or cleared by the [place] FDA and has been authorized for detection and/or diagnosis of SARS-CoV-2 by FDA under an Emergency Use Authorization (EUA). This EUA will remain in effect (meaning this test can be used) for the duration of the COVID-19 declaration under Section 564(b)(1) of the Act, 21 U.S.C. section 360bbb-3(b)(1), unless the authorization is terminated or revoked.  Performed at  [facility], [address], [place], [place] [address]   Blood Culture (routine x 2)     Status: Abnormal   Collection Time: 03/25/21 11:25 AM   Specimen: Right Antecubital; Blood  Result Value Ref Range Status   Specimen Description   Final    RIGHT ANTECUBITAL BOTTLES DRAWN AEROBIC AND ANAEROBIC Performed at [facility], [address] [place], [place] [address]    Special Requests   Final    Blood Culture adequate volume Performed at [facility], [address] [place], [place] [address]    Culture  Setup Time   Final    GRAM NEGATIVE RODS BLOOD ANAEROBIC BOTTLE Gram Stain Report Called to,Read Back By and Verified With: FARRELL,S @ [id number] 03/25/21 BY STEPHTR CRITICAL RESULT CALLED TO, READ BACK BY AND VERIFIED WITH: S,HURTH PHARMD _0  03/26/21 EB Performed at [facility], [place] [address], [place], [place] [address]    Culture ESCHERICHIA COLI (A)  Final   Report Status 03/28/2021 FINAL  Final   Organism ID, Bacteria ESCHERICHIA COLI  Final      Susceptibility   Escherichia coli - MIC*    AMPICILLIN >=32 RESISTANT Resistant     CEFAZOLIN <=4 SENSITIVE Sensitive     CEFEPIME <=0.12 SENSITIVE Sensitive     CEFTAZIDIME <=1 SENSITIVE Sensitive     CEFTRIAXONE <=0.25 SENSITIVE Sensitive     CIPROFLOXACIN <=0.25 SENSITIVE Sensitive     GENTAMICIN <=1 SENSITIVE Sensitive     IMIPENEM <=0.25 SENSITIVE Sensitive     TRIMETH/SULFA <=20 SENSITIVE Sensitive     AMPICILLIN/SULBACTAM >=32 RESISTANT Resistant     PIP/TAZO <=4 SENSITIVE Sensitive     * ESCHERICHIA COLI  Blood Culture (routine x 2)     Status: Abnormal   Collection Time: 03/25/21 11:25 AM   Specimen: BLOOD RIGHT HAND  Result Value Ref Range Status   Specimen Description   Final    BLOOD RIGHT HAND BOTTLES DRAWN AEROBIC AND ANAEROBIC Performed at [facility], [address] [place], [place] [address]    Special Requests   Final    Blood Culture adequate volume Performed at [facility], [address],  [place], [place] [address]    Culture  Setup Time   Final    GRAM NEGATIVE RODS BLOOD ANAEROBIC BOTTLE Gram Stain Report Called to,Read Back By and Verified With: FARRELL,S @ [phone] 03/25/21 BY STEPHTR Performed at [facility], [address], [place], [place] [address]    Culture (A)  Final    ESCHERICHIA COLI SUSCEPTIBILITIES PERFORMED ON PREVIOUS CULTURE WITHIN THE LAST 5 DAYS. Performed at [facility], [place] [address] [place], [place] [address]    Report Status 03/28/2021 FINAL  Final  Blood Culture ID Panel (Reflexed)     Status: Abnormal   Collection Time: 03/25/21 11:25 AM  Result Value Ref Range Status   Enterococcus faecalis NOT DETECTED NOT DETECTED Final   Enterococcus Faecium NOT DETECTED NOT DETECTED Final   Listeria monocytogenes NOT DETECTED NOT DETECTED Final   Staphylococcus species NOT DETECTED NOT DETECTED Final   Staphylococcus aureus (BCID) NOT DETECTED NOT DETECTED Final   Staphylococcus epidermidis NOT DETECTED NOT DETECTED Final   Staphylococcus lugdunensis NOT DETECTED NOT DETECTED Final   Streptococcus species NOT DETECTED NOT DETECTED Final   Streptococcus agalactiae NOT DETECTED NOT DETECTED Final   Streptococcus pneumoniae NOT DETECTED NOT DETECTED Final   Streptococcus pyogenes NOT DETECTED NOT DETECTED Final   A.calcoaceticus-baumannii NOT DETECTED NOT DETECTED  Final   Bacteroides fragilis NOT DETECTED NOT DETECTED Final   Enterobacterales DETECTED (A) NOT DETECTED Final    Comment: Enterobacterales represent a large order of gram negative bacteria, not a single organism. CRITICAL RESULT CALLED TO, READ BACK BY AND VERIFIED WITH: S,HURTH PHARMD _0  03/26/21 EB    Enterobacter cloacae complex NOT DETECTED NOT DETECTED Final   Escherichia coli DETECTED (A) NOT DETECTED Final    Comment: CRITICAL RESULT CALLED TO, READ BACK BY AND VERIFIED WITH: S,HURTH PHARMD _1  03/26/21 EB    Klebsiella aerogenes NOT DETECTED NOT DETECTED Final   Klebsiella  oxytoca NOT DETECTED NOT DETECTED Final   Klebsiella pneumoniae NOT DETECTED NOT DETECTED Final   Proteus species NOT DETECTED NOT DETECTED Final   Salmonella species NOT DETECTED NOT DETECTED Final   Serratia marcescens NOT DETECTED NOT DETECTED Final   Haemophilus influenzae NOT DETECTED NOT DETECTED Final   Neisseria meningitidis NOT DETECTED NOT DETECTED Final   Pseudomonas aeruginosa NOT DETECTED NOT DETECTED Final   Stenotrophomonas maltophilia NOT DETECTED NOT DETECTED Final   Candida albicans NOT DETECTED NOT DETECTED Final   Candida auris NOT DETECTED NOT DETECTED Final   Candida glabrata NOT DETECTED NOT DETECTED Final   Candida krusei NOT DETECTED NOT DETECTED Final   Candida parapsilosis NOT DETECTED NOT DETECTED Final   Candida tropicalis NOT DETECTED NOT DETECTED Final   Cryptococcus neoformans/gattii NOT DETECTED NOT DETECTED Final   CTX-M ESBL NOT DETECTED NOT DETECTED Final   Carbapenem resistance IMP NOT DETECTED NOT DETECTED Final   Carbapenem resistance KPC NOT DETECTED NOT DETECTED Final   Carbapenem resistance NDM NOT DETECTED NOT DETECTED Final   Carbapenem resist OXA 48 LIKE NOT DETECTED NOT DETECTED Final   Carbapenem resistance VIM NOT DETECTED NOT DETECTED Final    Comment: Performed at [facility], [address], [place], [place] [address]  Urine Culture     Status: None   Collection Time: 03/25/21  2:45 PM   Specimen: In/Out Cath Urine  Result Value Ref Range Status   Specimen Description   Final    IN/OUT CATH URINE Performed at [facility], [address] [place], [place] [address]    Special Requests   Final    NONE Performed at [facility], [address], [place], [place] [address]    Culture   Final    NO GROWTH Performed at [facility], [place] [address], [place], [place] [address]    Report Status 03/27/2021 FINAL  Final  Aerobic/Anaerobic Culture w Gram Stain (surgical/deep wound)     Status: None (Preliminary result)    Collection Time: 03/26/21  9:28 AM   Specimen: Foot; Wound  Result Value Ref Range Status   Specimen Description   Final    FOOT Performed at [facility], [address], [place], [place] [address]    Special Requests   Final    LEFT Performed at [facility], [address], [place], [place] [address]    Gram Stain   Final    RARE WBC PRESENT, PREDOMINANTLY PMN FEW GRAM POSITIVE COCCI IN PAIRS RARE GRAM NEGATIVE RODS    Culture   Final    FEW GRAM NEGATIVE RODS CULTURE REINCUBATED FOR BETTER GROWTH Performed at [facility], [place] [address], [place], [place] [address]    Report Status PENDING  Incomplete       Today   Subjective    Nazareth Kirk today has no new concerns No  fever  Or chills  No nausea, vomiting, diarrhea (had normal BM) Left leg swelling and discomfort improved significantly         Patient has been seen and examined prior to discharge   Objective   Blood pressure 129/73, pulse 72, temperature 99 F (37.2 C), resp. rate 17, height 6' 4" (1.93 m), weight 113.9 kg, SpO2 96 %.   Intake/Output Summary (Last 24 hours) at 03/28/2021 1157 Last data filed at 03/28/2021 0933 Gross per 24 hour  Intake 1043 ml  Output --  Net 1043 ml    Exam Gen:- Awake Alert,  in no apparent distress HEENT:- [place].AT, No sclera icterus Neck-Supple Neck,No JVD,. Lungs-  CTAB , good air movement  CV- S1, S2 normal, regular Abd-  +ve B.Sounds, Abd Soft, No tenderness,    Extremity/Skin:-   pedal pulses present , overall much improved and resolving left leg erythema ,warmth, swelling and tenderness, left second toe open wound with no significant drainage at this time Psych-affect is appropriate, oriented x3 Neuro-no new focal deficits, no tremors Lt Foot--- please see photos  in epic   Data Review   CBC w Diff:  Lab Results  Component Value Date   WBC 9.2 03/28/2021   HGB 13.6 03/28/2021   HCT 40.7 03/28/2021   PLT 182 03/28/2021   LYMPHOPCT 6 03/25/2021    MONOPCT 1 03/25/2021   EOSPCT 1 03/25/2021   BASOPCT 0 03/25/2021    CMP:  Lab Results  Component Value Date   NA 139 03/26/2021   K 3.6 03/26/2021   CL 104 03/26/2021   CO2 28 03/26/2021   BUN 16 03/26/2021   CREATININE 0.75 03/26/2021   PROT 7.3 03/25/2021   ALBUMIN 4.1 03/25/2021   BILITOT 1.1 03/25/2021   ALKPHOS 59 03/25/2021   AST 19 03/25/2021   ALT 23 03/25/2021  .   Total Discharge time is about 33 minutes  Roxan Hockey M.D on 03/28/2021 at 11:57 AM  Go to [email or web address] -  for contact info  [facility] - Office  [phone]

## 2021-03-28 NOTE — Progress Notes (Signed)
Patient discharged home today, transported home by wife and son. Discharge paperwork went over with patient and wife, both verbalized understanding of discharge paperwork. Belongings sent home with patient. Patient stable upon discharge.

## 2021-03-28 NOTE — Discharge Instructions (Signed)
1) please take doxycycline and cephalexin/Keflex antibiotic as prescribed for the next week--- avoid excessive sun exposure while taking doxycycline  2) please follow-up with your primary care physician Brandon Cornwall, MD --- within a week for recheck and reevaluation  3)Wound care to left foot, 2nd toe, distal tip:  Cleanse with saline , pat gently dry. Apply a thin layer of mupirocin to wound, cover with saline moistened gauze, cover with dry gauze and secure with tape.  Change twice daily and PRN dressing dislodgement.

## 2021-03-31 LAB — AEROBIC/ANAEROBIC CULTURE W GRAM STAIN (SURGICAL/DEEP WOUND)

## 2021-05-19 ENCOUNTER — Other Ambulatory Visit: Payer: Self-pay

## 2021-05-19 ENCOUNTER — Encounter
Payer: No Typology Code available for payment source | Attending: Physical Medicine & Rehabilitation | Admitting: Physical Medicine & Rehabilitation

## 2021-05-19 ENCOUNTER — Encounter: Payer: Self-pay | Admitting: Physical Medicine & Rehabilitation

## 2021-05-19 VITALS — BP 130/71 | HR 99 | Temp 98.7°F | Ht 76.0 in | Wt 238.0 lb

## 2021-05-19 DIAGNOSIS — G8928 Other chronic postprocedural pain: Secondary | ICD-10-CM | POA: Diagnosis not present

## 2021-05-19 MED ORDER — DIAZEPAM 10 MG PO TABS: 10.0000 mg | ORAL_TABLET | Freq: Once | ORAL | 0 refills | Status: AC

## 2021-05-19 NOTE — Patient Instructions (Signed)
Genicular nerve blocks were performed today. This is to block pain signals from the knee. A local anesthetic was used to block the knee therefore this will not be a permanent procedure. Please keep track of your pain today and compared to the pain that you had prior to the injection. At next visit we will discuss the results. If it is helpful but only short-term we would need to confirm this with an additional injection prior to proceeding with a radiofrequency neurotomy  °

## 2021-05-19 NOTE — Progress Notes (Signed)
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 Patient placed supine on the fluoroscopy table AP images of the knee joint were obtained. A 25-gauge 1.5 inch needle was used to anesthetize the skin and subcutaneous tissue with 1% lidocaine, 1 cc at each of 3 locations. Then a 22-gauge 3.5" spinal needle was inserted targeting the junction of the medial flare of the tibia with the shaft of the tibia, bone contact made and confirmed with lateral imaging. Then Isovue 200  x0.5 mL demonstrated no intravascular uptake followed by injection of 1.39ml .5% bupivacaine. Then the junction of the medial epicondyles of the femur with the femoral shaft was targeted needle was advanced under fluoroscopic guidance until bone contact. Appropriate depth was obtained and confirmed with lateral images. Then Isovue 200 x0.5 mL demonstrated no intravascular uptake followed by injection of 1.72ml of .5% bupivacaine. Then the junction of the lateral femoral condyle with the femoral shaft was targeted. 22-gauge 3.5 inch needle was advanced under fluoroscopic guidance until bone contact. Appropriate depth was confirmed with lateral imaging. 0.5 mL of Isovue 200 injected followed by injection of 1.5 cc of .5% bupivacaine solution. Patient tolerated procedure well. Post procedure instructions given  Pre injection pain 7/10 Post injection pain 2/10  Pt notes that Right genicular nerve block wearing off , will schedule for Right genicular RF Valium 10mg  po prior to procedure

## 2021-05-19 NOTE — Progress Notes (Signed)
  PROCEDURE RECORD [facility]   Name: Brandon Strickland DOB:10-19-1960 MRN: [id number]  Date:05/19/2021  Physician: Claudette Laws, MD    Nurse/CMA: Aalia Greulich RN  Allergies:  Allergies  Allergen Reactions   Latex Rash    Other reaction(s): rash/itching   Tape     Consent Signed: Yes.    Is patient diabetic? No.  CBG today? .  Pregnant: No. LMP: No LMP for male patient. (age 62-55)  Anticoagulants: no Anti-inflammatory: no Antibiotics: no  Procedure: Left Genicular Nerve Block  Position: Prone Start Time: 2:05  End Time: 225p  Fluoro Time: 43 sec  RN/CMA Nedra Hai, CMA ShumakerRN    Time 1:38pm 240    BP 130/71 145/84    Pulse 96 88    Respirations 16 16    O2 Sat 97 98    S/S 6 6    Pain Level 7/10 2/10     D/C home with no one, patient A & O X 3, D/C instructions reviewed, and sits independently.

## 2021-05-23 ENCOUNTER — Emergency Department (HOSPITAL_COMMUNITY)
Admission: EM | Admit: 2021-05-23 | Discharge: 2021-05-23 | Disposition: A | Discharge status: Home / Self Care | Payer: No Typology Code available for payment source | Attending: Emergency Medicine | Admitting: Emergency Medicine

## 2021-05-23 ENCOUNTER — Encounter (HOSPITAL_COMMUNITY): Payer: Self-pay

## 2021-05-23 ENCOUNTER — Other Ambulatory Visit: Payer: Self-pay

## 2021-05-23 DIAGNOSIS — D72829 Elevated white blood cell count, unspecified: Secondary | ICD-10-CM | POA: Insufficient documentation

## 2021-05-23 DIAGNOSIS — Z87891 Personal history of nicotine dependence: Secondary | ICD-10-CM | POA: Insufficient documentation

## 2021-05-23 DIAGNOSIS — Z79899 Other long term (current) drug therapy: Secondary | ICD-10-CM | POA: Diagnosis not present

## 2021-05-23 DIAGNOSIS — I10 Essential (primary) hypertension: Secondary | ICD-10-CM | POA: Diagnosis not present

## 2021-05-23 DIAGNOSIS — Z96653 Presence of artificial knee joint, bilateral: Secondary | ICD-10-CM | POA: Diagnosis not present

## 2021-05-23 DIAGNOSIS — L03116 Cellulitis of left lower limb: Secondary | ICD-10-CM | POA: Insufficient documentation

## 2021-05-23 DIAGNOSIS — M79605 Pain in left leg: Secondary | ICD-10-CM | POA: Diagnosis present

## 2021-05-23 LAB — COMPREHENSIVE METABOLIC PANEL
ALT: 21 U/L (ref 0–44)
AST: 16 U/L (ref 15–41)
Albumin: 4.1 g/dL (ref 3.5–5.0)
Alkaline Phosphatase: 55 U/L (ref 38–126)
Anion gap: 6 (ref 5–15)
BUN: 20 mg/dL (ref 8–23)
CO2: 28 mmol/L (ref 22–32)
Calcium: 9.3 mg/dL (ref 8.9–10.3)
Chloride: 102 mmol/L (ref 98–111)
Creatinine, Ser: 0.86 mg/dL (ref 0.61–1.24)
GFR, Estimated: >60 mL/min (ref >60)
Glucose, Bld: 109 mg/dL — ABNORMAL HIGH (ref 70–99)
Potassium: 3.7 mmol/L (ref 3.5–5.1)
Sodium: 136 mmol/L (ref 135–145)
Total Bilirubin: 0.6 mg/dL (ref 0.3–1.2)
Total Protein: 7.3 g/dL (ref 6.5–8.1)

## 2021-05-23 LAB — CBC WITH DIFFERENTIAL/PLATELET
Abs Immature Granulocytes: 0.06 10*3/uL (ref 0.00–0.07)
Basophils Absolute: 0.1 10*3/uL (ref 0.0–0.1)
Basophils Relative: 1 %
Eosinophils Absolute: 0.1 10*3/uL (ref 0.0–0.5)
Eosinophils Relative: 1 %
HCT: 44.2 % (ref 39.0–52.0)
Hemoglobin: 14.5 g/dL (ref 13.0–17.0)
Immature Granulocytes: 1 %
Lymphocytes Relative: 15 %
Lymphs Abs: 1.6 10*3/uL (ref 0.7–4.0)
MCH: 32.9 pg (ref 26.0–34.0)
MCHC: 32.8 g/dL (ref 30.0–36.0)
MCV: 100.2 fL — ABNORMAL HIGH (ref 80.0–100.0)
Monocytes Absolute: 0.9 10*3/uL (ref 0.1–1.0)
Monocytes Relative: 8 %
Neutro Abs: 8.3 10*3/uL — ABNORMAL HIGH (ref 1.7–7.7)
Neutrophils Relative %: 74 %
Platelets: 192 10*3/uL (ref 150–400)
RBC: 4.41 MIL/uL (ref 4.22–5.81)
RDW: 13 % (ref 11.5–15.5)
WBC: 11 10*3/uL — ABNORMAL HIGH (ref 4.0–10.5)
nRBC: 0 % (ref 0.0–0.2)

## 2021-05-23 LAB — LACTIC ACID, PLASMA
Lactic Acid, Venous: 0.9 mmol/L (ref 0.5–1.9)
Lactic Acid, Venous: 1 mmol/L (ref 0.5–1.9)

## 2021-05-23 MED ORDER — OXYCODONE-ACETAMINOPHEN 5-325 MG PO TABS
1.0000 | ORAL_TABLET | Freq: Once | ORAL | Status: DC
  Filled 2021-05-23: qty 1

## 2021-05-23 MED ORDER — ENOXAPARIN SODIUM 100 MG/ML IJ SOSY
100.0000 mg | PREFILLED_SYRINGE | Freq: Once | INTRAMUSCULAR | Status: AC
  Administered 2021-05-23: 100 mg via SUBCUTANEOUS
  Filled 2021-05-23: qty 1

## 2021-05-23 MED ORDER — CLINDAMYCIN HCL 300 MG PO CAPS: 300.0000 mg | ORAL_CAPSULE | Freq: Three times a day (TID) | ORAL | 0 refills | Status: DC

## 2021-05-23 MED ORDER — VANCOMYCIN HCL IN DEXTROSE 1-5 GM/200ML-% IV SOLN
1000.0000 mg | Freq: Once | INTRAVENOUS | Status: AC
  Administered 2021-05-23: 1000 mg via INTRAVENOUS
  Filled 2021-05-23: qty 200

## 2021-05-23 MED ORDER — OXYCODONE-ACETAMINOPHEN 5-325 MG PO TABS: 1.0000 | ORAL_TABLET | Freq: Four times a day (QID) | ORAL | 0 refills | Status: DC | PRN

## 2021-05-23 NOTE — Discharge Instructions (Addendum)
You most likely have a cellulitis of your left lower leg.  This may be related to a circulation issue of your lower legs.  I recommend that you follow-up with your primary care provider for recheck, you may also need to see a vascular specialist since this is a recurring issue.  Elevate your leg when possible and minimal walking or standing.  You have been scheduled to return here for an ultrasound of your left lower leg.  Someone from the scheduling department should contact you, you may also call them at [phone] to schedule an appointment time to return for your ultrasound.  Please return to the emergency department in 2 to 3 days for recheck of your leg.

## 2021-05-23 NOTE — ED Triage Notes (Signed)
Pt presents to ED with complaints of cellulitis in left lower leg started yesterday am. Also noticed chills. Denies fever.

## 2021-05-23 NOTE — ED Provider Notes (Signed)
[facility] EMERGENCY DEPARTMENT Provider Note   CSN: [id number] Arrival date & time: 05/23/21  1319     History Chief Complaint  Patient presents with   Cellulitis    Brandon Strickland is a 60 y.o. male.  HPI   Brandon Strickland is a 61 y.o. male who presents to the Emergency Department complaining of pain and increasing redness of the left lower leg.  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  He noticed having chills yesterday and woke this morning with redness of the lower leg that extends just distal to the knee.  He describes aching pain of the posterior and medial aspects of the leg.  This pain is worse with walking or standing.  He denies known injury or open wounds of the leg.  He does have a wound of the third toe of the left foot that is followed by podiatry.  He states the wound has been healing without difficulties.  He denies history of DVT of this leg, chest pain, shortness of breath, nausea vomiting, or increased numbness of the leg or foot.    Past Medical History:  Diagnosis Date   Hypertension     Patient Active Problem List   Diagnosis Date Noted   E coli bacteremia 03/26/2021   Sepsis (HCC) 03/26/2021   Sepsis due to Escherichia coli (E. coli) /-E. 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 Authorizing Provider  acetaminophen (TYLENOL) 500 MG tablet Take 1,000 mg by mouth every 4 (four) hours as needed for moderate pain. 02/07/20   [provider]  ascorbic acid (VITAMIN C) 250 MG tablet Take 250 mg by mouth daily.    [provider]  Cholecalciferol 25 MCG (1000 UT) tablet Take 1,000 Units by mouth daily.    [provider]  clobetasol ointment (TEMOVATE) 0.05 % Apply topically. 07/28/15   [provider]  doxycycline (MONODOX) 100 MG capsule TAKE ONE CAPSULE BY MOUTH TWICE A DAY FOR INFECTION 04/15/21   [provider]  DULoxetine (CYMBALTA) 30 MG capsule Take 1 capsule (30 mg total) by mouth daily. 02/04/21   Kirsteins, Victorino Sparrow, MD  famotidine (PEPCID) 20 MG tablet Take 20 mg by mouth daily. 01/12/20   [provider]  lisinopril-hydrochlorothiazide (ZESTORETIC) 10-12.5 MG tablet  Take 1 tablet by mouth daily. 01/12/20   [provider]  loratadine (CLARITIN) 10 MG tablet Take 10 mg by mouth daily.    [provider]  mupirocin cream (BACTROBAN) 2 % Apply topically 2 (two) times daily. 03/28/21   Shon Hale, MD  naproxen (NAPROSYN) 500 MG tablet TAKE ONE TABLET BY MOUTH TWICE A DAY AS NEEDED FOR PAIN (TAKE WITH FOOD) 01/22/21   [provider]    Allergies    Latex and Tape  Review of Systems   Review of Systems  Constitutional:  Positive for chills. Negative for fatigue and fever.  Respiratory:  Negative for shortness of breath.   Cardiovascular:  Negative for chest pain and leg swelling.  Gastrointestinal:  Negative for abdominal pain, diarrhea, nausea and vomiting.  Genitourinary:   Negative for dysuria and flank pain.  Musculoskeletal:  Negative for arthralgias, back pain, myalgias (Left lower leg pain), neck pain and neck stiffness.  Skin:  Positive for color change. Negative for pallor, rash and wound.  Neurological:  Negative for dizziness, syncope, weakness and numbness.  Hematological:  Does not bruise/bleed easily.   Physical Exam Updated Vital Signs BP (!) 143/83 (BP Location: Right Arm)   Pulse 88   Temp 98 F (36.7 C) (Oral)   Resp 18   Ht 6\' 4"  (1.93 m)   Wt 110 kg   SpO2 99%   BMI 29.51 kg/m   Physical Exam Vitals and nursing note reviewed.  Constitutional:      Appearance: Normal appearance. He is not ill-appearing or toxic-appearing.  HENT:     Head: Atraumatic.  Cardiovascular:     Rate and Rhythm: Normal rate and regular rhythm.     Pulses: Normal pulses.     Comments: Left dorsalis pedis and posterior pulses heard with portable Doppler. Pulmonary:     Effort: Pulmonary effort is normal.  Abdominal:     Palpations: Abdomen is soft.     Tenderness: There is no abdominal tenderness.  Musculoskeletal:        General: Tenderness present. No signs of injury.     Right lower leg: No edema.     Left lower leg: Edema present.     Comments: Diffuse tenderness of the left lower extremity.  There is excessive warmth noted distally with erythema of the medial and lateral aspects.  No lymphangitis.  Healing wound to the left third toe.  No erythema of the toes or dorsal foot.  See attached photos  Skin:    General: Skin is warm.     Capillary Refill: Capillary refill takes less than 2 seconds.     Findings: Erythema present.  Neurological:     General: No focal deficit present.     Mental Status: He is alert.     Sensory: No sensory deficit.     Motor: No weakness.         ED Results / Procedures / Treatments   Labs (all labs ordered are listed, but only abnormal results are displayed) Labs Reviewed  COMPREHENSIVE METABOLIC PANEL -  Abnormal; Notable for the following components:      Result Value   Glucose, Bld 109 (*)    All other components within normal limits  CBC WITH DIFFERENTIAL/PLATELET - Abnormal; Notable for the following components:   WBC 11.0 (*)    MCV 100.2 (*)    Neutro Abs 8.3 (*)    All other components within normal limits  LACTIC ACID, PLASMA  LACTIC  ACID, PLASMA    EKG None  Radiology No results found.  Procedures Procedures   Medications Ordered in ED Medications  vancomycin (VANCOCIN) IVPB 1000 mg/200 mL premix (has no administration in time range)    ED Course  I have reviewed the triage vital signs and the nursing notes.  Pertinent labs & imaging results that were available during my care of the patient were reviewed by me and considered in my medical decision making (see chart for details).    MDM Rules/Calculators/A&P                           Patient here with complaint of chills yesterday and woke with redness and increasing pain of the left lower extremity.  History of recurrent cellulitis.  No prior DVT of this leg.  He is not taking any anticoagulants.  Vitals reassuring  Labs interpreted by me, lactic acid reassuring.  No significant electrolyte derangements.  Mildly elevated leukocytosis.  Ultrasound unavailable at this time of day. Will plan to schedule patient for out patient venous imaging of the left lower extremity for tomorrow.  Patient will be given Lovenox tonight.  I feel that he is appropriate for discharge home.  He agrees to treatment plan and to return tomorrow for imaging.  Prescription written for oral antibiotics.  He will return here in 2 to 3 days for recheck of his leg.   Final Clinical Impression(s) / ED Diagnoses Final diagnoses:  Cellulitis of left lower extremity    Rx / DC Orders ED Discharge Orders     None        Pauline Aus, PA-C 05/24/21 1437    Bethann Berkshire, MD 05/25/21 2142

## 2021-05-25 MED FILL — Oxycodone w/ Acetaminophen Tab 5-325 MG: ORAL | Qty: 6 | Status: AC

## 2021-07-14 ENCOUNTER — Encounter
Payer: No Typology Code available for payment source | Attending: Physical Medicine & Rehabilitation | Admitting: Physical Medicine & Rehabilitation

## 2021-07-14 ENCOUNTER — Other Ambulatory Visit: Payer: Self-pay

## 2021-07-14 ENCOUNTER — Encounter: Payer: Self-pay | Admitting: Physical Medicine & Rehabilitation

## 2021-07-14 VITALS — BP 137/82 | HR 72 | Temp 99.0°F | Ht 76.0 in | Wt 257.6 lb

## 2021-07-14 DIAGNOSIS — G8928 Other chronic postprocedural pain: Secondary | ICD-10-CM | POA: Diagnosis present

## 2021-07-14 NOTE — Patient Instructions (Signed)
You had a radio frequency procedure today This was done to alleviate joint pain in your knee area We injected lidocaine which is a local anesthetic.  You may experience soreness at the injection sites. You may also experienced some irritation of the nerves that were heated I'm recommending ice for 30 minutes every 2 hours as needed for the next 24-48 hours   

## 2021-07-14 NOTE — Progress Notes (Signed)
  PROCEDURE RECORD [facility]   Name: Joangel Vanosdol DOB:[birth year] MRN: [id number]  Date:07/14/2021  Physician: Claudette Laws, MD    Nurse/CMA: Shumaker RN  Allergies:  Allergies  Allergen Reactions   Latex Rash    Other reaction(s): rash/itching   Tape     Consent Signed: Yes.    Is patient diabetic? No.  CBG today? .  Pregnant: No. LMP: No LMP for male patient. (age 62-55)  Anticoagulants: no Anti-inflammatory: no Antibiotics: no  Procedure: right genicular radiofrequency neurotomy  Position: Supine Start Time: 12:33  End Time: 12:53  Fluoro Time: 31 sec  RN/CMA Nedra Hai, CMA Shumaker RN    Time 12:00 pm 12:55    BP 137/82 138/84    Pulse 72 78    Respirations 16 16    O2 Sat 98 97    S/S 6 6    Pain Level 7/10 uncertain     D/C home with wife/son, patient A & O X 3, D/C instructions reviewed, and sits independently.

## 2021-07-14 NOTE — Progress Notes (Signed)
RIght genicular nerve radiofrequency neurotomy under fluoroscopic guidance  Indication Chronic severe  knee pain that has not responded to PT, medication, and other conservative care.THere has been 50% relief of usual knee pain with genicular nerve blocks under fluoroscopic guidance  Informed consent was obtained after discussing risks and benefits of procedure with the patient.  These include bleeding, bruising and infection as well as foot numbness. Pt place in supine position on the fluoro table .  Static images identified distal femur and proximal tibia.  Lateral and medial supracondylar , as well as medial tibial flare prepped with betadine.  Marked under fluoro and then a 25 g 1.5 inch needle was used to anesthetize skin and subcutaneous tissue. 1.5 cc was infiltrate into each of 3 sites. Then a 18-gauge 10cm RF needle with a 65mm active  tip  was inserted under fluoroscopic guidance first targeting the medial tibial flare midpoint. After bone contact was made lateral images confirmed proper positioning at midpoint of shart ,then Motor stim at 2 Hz demonstrated no motor twitch followed by injection of 46ml of 2% lidocaine and radiofrequency lesioning 80 deg for 90 sec. . This same procedure was treated for the lateral and medial supracondylar areas using same equipment and technique. Patient tolerated procedure well. Post procedure instructions given.

## 2021-09-08 ENCOUNTER — Ambulatory Visit: Payer: No Typology Code available for payment source | Admitting: Physical Medicine & Rehabilitation

## 2021-09-15 ENCOUNTER — Ambulatory Visit: Payer: No Typology Code available for payment source | Admitting: Physical Medicine & Rehabilitation

## 2021-09-16 ENCOUNTER — Encounter: Payer: No Typology Code available for payment source | Admitting: Physical Medicine & Rehabilitation

## 2021-10-21 ENCOUNTER — Telehealth: Payer: Self-pay | Admitting: Physical Medicine & Rehabilitation

## 2021-10-21 MED ORDER — DIAZEPAM 10 MG PO TABS: 10.0000 mg | ORAL_TABLET | Freq: Once | ORAL | 0 refills | Status: AC

## 2021-10-21 NOTE — Telephone Encounter (Signed)
Patient is requestig valium to be sent in for his procedure on 10/25/21. His pharmacy is [facility] in [place].

## 2021-10-21 NOTE — Telephone Encounter (Signed)
Valium 10 mg called into [facility]

## 2021-10-25 ENCOUNTER — Encounter: Payer: No Typology Code available for payment source | Admitting: Physical Medicine & Rehabilitation

## 2021-11-15 ENCOUNTER — Other Ambulatory Visit: Payer: Self-pay

## 2021-11-15 ENCOUNTER — Encounter: Payer: Self-pay | Admitting: Physical Medicine & Rehabilitation

## 2021-11-15 ENCOUNTER — Encounter
Payer: No Typology Code available for payment source | Attending: Physical Medicine & Rehabilitation | Admitting: Physical Medicine & Rehabilitation

## 2021-11-15 VITALS — BP 136/80 | HR 93 | Temp 98.4°F | Ht 76.0 in | Wt 265.0 lb

## 2021-11-15 DIAGNOSIS — G8928 Other chronic postprocedural pain: Secondary | ICD-10-CM

## 2021-11-15 NOTE — Patient Instructions (Signed)
Genicular RF were performed today. This is to block pain signals from the knee. A local anesthetic was used to block the knee which will wear off in several hours ?

## 2021-11-15 NOTE — Progress Notes (Signed)
LEFT genicular nerve radiofrequency neurotomy under fluoroscopic guidance ? ?Indication Chronic severe  knee pain that has not responded to PT, medication, and other conservative care.THere has been 50% relief of usual knee pain with genicular nerve blocks under fluoroscopic guidance ? ?Informed consent was obtained after discussing risks and benefits of procedure with the patient.  These include bleeding, bruising and infection as well as foot numbness. ?Pt place in supine position on the fluoro table .  Static images identified distal femur and proximal tibia.  Lateral and medial supracondylar , as well as medial tibial flare prepped with betadine.  Marked under fluoro and then a 25 g 1.5 inch needle was used to anesthetize skin and subcutaneous tissue with 1% lidocaine. 1.5 cc was infiltrate into each of 3 sites. Then a 18-gauge 10cm RF needle with a 47mm curved active  tip  was inserted under fluoroscopic guidance first targeting the medial tibial flare midpoint. After bone contact was made lateral images confirmed proper positioning at midpoint of shart ,then Motor stim at 2 Hz demonstrated no motor twitch followed by injection of 1.72ml of 2% lidocaine and radiofrequency lesioning 80 deg for 90 sec. . This same procedure was treated for the lateral and medial supracondylar areas using same equipment and technique. Patient tolerated procedure well. Post procedure instructions given.  ?

## 2021-11-15 NOTE — Progress Notes (Signed)
?  PROCEDURE RECORD ?[facility] ? ? ?Name: Brandon Strickland ?DOB:1960-12-04 ?MRN: [id number] ? ?Date:11/15/2021  Physician: Alysia Penna, MD   ? ?Nurse/CMA: Jorja Loa MA ? ?Allergies:  ?Allergies  ?Allergen Reactions  ? Latex Rash  ?  Other reaction(s): rash/itching  ? Tape   ? ? ?Consent Signed: Yes.    Is patient diabetic? No.  CBG today? N/A ? ?Pregnant: No. LMP: No LMP for male patient. (age 61-55) ? ?Anticoagulants: no ?Anti-inflammatory: no ?Antibiotics: no ? ?Procedure: Left Genicular Nerve Block  Position: Supine ?Start Time: 12:44 pm  End Time: 1:00 PM  Fluoro Time: 1:02 ? ?RN/CMA Gwyndolyn Saxon MA    ?Time 12:21 pm 1:08 PM    ?BP 136/80 143/82    ?Pulse 93 90    ?Respirations 16 16    ?O2 Sat 98 98    ?S/S 6 6    ?Pain Level 6/10 4/10    ? ?D/C home with Son, patient A & O X 3, D/C instructions reviewed, and sits independently. ? ? ? ? ? ? ? ?

## 2021-12-06 ENCOUNTER — Telehealth: Payer: Self-pay | Admitting: Genetic Counselor

## 2021-12-06 NOTE — Telephone Encounter (Signed)
Scheduled appt per 4/11 referral. Pt is aware of appt date and time. Pt is aware to arrive 15 mins prior to appt time and to bring and updated insurance card. Pt is aware of appt location.   ?

## 2021-12-08 ENCOUNTER — Ambulatory Visit: Payer: No Typology Code available for payment source | Admitting: Physical Medicine & Rehabilitation

## 2022-01-06 ENCOUNTER — Other Ambulatory Visit: Payer: Self-pay | Admitting: Internal Medicine

## 2022-01-06 ENCOUNTER — Other Ambulatory Visit (HOSPITAL_COMMUNITY): Payer: Self-pay | Admitting: Internal Medicine

## 2022-01-06 DIAGNOSIS — I872 Venous insufficiency (chronic) (peripheral): Secondary | ICD-10-CM

## 2022-01-09 ENCOUNTER — Other Ambulatory Visit: Payer: Self-pay | Admitting: Genetic Counselor

## 2022-01-09 ENCOUNTER — Inpatient Hospital Stay: Payer: No Typology Code available for payment source | Attending: Genetic Counselor | Admitting: Genetic Counselor

## 2022-01-09 ENCOUNTER — Other Ambulatory Visit: Payer: Self-pay

## 2022-01-09 ENCOUNTER — Inpatient Hospital Stay: Payer: No Typology Code available for payment source

## 2022-01-09 DIAGNOSIS — Z1501 Genetic susceptibility to malignant neoplasm of breast: Secondary | ICD-10-CM

## 2022-01-09 DIAGNOSIS — Z8481 Family history of carrier of genetic disease: Secondary | ICD-10-CM

## 2022-01-09 DIAGNOSIS — Z800 Family history of malignant neoplasm of digestive organs: Secondary | ICD-10-CM

## 2022-01-09 DIAGNOSIS — Z8041 Family history of malignant neoplasm of ovary: Secondary | ICD-10-CM

## 2022-01-09 DIAGNOSIS — Z803 Family history of malignant neoplasm of breast: Secondary | ICD-10-CM

## 2022-01-09 LAB — GENETIC SCREENING ORDER

## 2022-01-10 ENCOUNTER — Encounter: Payer: Self-pay | Admitting: Physical Medicine & Rehabilitation

## 2022-01-10 ENCOUNTER — Encounter
Payer: No Typology Code available for payment source | Attending: Physical Medicine & Rehabilitation | Admitting: Physical Medicine & Rehabilitation

## 2022-01-10 VITALS — BP 150/92 | HR 107 | Ht 76.0 in | Wt 266.8 lb

## 2022-01-10 DIAGNOSIS — G8928 Other chronic postprocedural pain: Secondary | ICD-10-CM | POA: Diagnosis present

## 2022-01-10 NOTE — Progress Notes (Signed)
? ?Subjective:  ? ? Patient ID: Brandon Strickland, male    DOB: [birth year], 61 y.o.   MRN: [id number] ? ?HPI ?61 year old male with history of chronic knee pain with osteoarthritis.  He underwent bilateral knee replacements in 2020 but had residual postoperative pain that was moderate to severe.  He was referred to this clinic through the [place] and underwent genicular nerve blocks both on the right side and left side which resulted in a temporary improvement of greater than 50% of his pain.  He therefore underwent first a right genicular RF which was performed in November 2022 and then a left genicular RF performed in March 2023.  As expected the left knee is still feeling like pain has diminished significantly.  The right knee is starting to get more painful. ?Interval medical history significant for right pretibial stasis dermatitis with ulceration.  He is going to vascular surgery as well as dermatology for further assessment and treatment.  He is currently on antibiotics. ? ?Pain Inventory ?Average Pain 7 ?Pain Right Now 7 ?My pain is sharp, stabbing, and aching ? ?In the last 24 hours, has pain interfered with the following? ?General activity 0 ?Relation with others 3 ?Enjoyment of life 1 ?What TIME of day is your pain at its worst? morning  and night ?Sleep (in general) Fair ? ?Pain is worse with: walking, bending, sitting, inactivity, and standing ?Pain improves with: medication ?Relief from Meds: 6 ? ?Family History  ?Problem Relation Age of Onset  ? Diabetes Mellitus II Mother   ? ?Social History  ? ?Socioeconomic History  ? Marital status: Married  ?  Spouse name: Not on file  ? Number of children: Not on file  ? Years of education: Not on file  ? Highest education level: Not on file  ?Occupational History  ? Not on file  ?Tobacco Use  ? Smoking status: Former  ?  Types: Cigarettes  ?  Quit date: 01/03/2012  ?  Years since quitting: 10.0  ? Smokeless tobacco: Never  ?Vaping Use  ? Vaping Use: Never used  ?Substance  and Sexual Activity  ? Alcohol use: Yes  ?  Alcohol/week: 3.0 standard drinks  ?  Types: 3 Cans of beer per week  ? Drug use: Never  ? Sexual activity: Not on file  ?Other Topics Concern  ? Not on file  ?Social History Narrative  ? Not on file  ? ?Social Determinants of Health  ? ?Financial Resource Strain: Not on file  ?Food Insecurity: Not on file  ?Transportation Needs: Not on file  ?Physical Activity: Not on file  ?Stress: Not on file  ?Social Connections: Not on file  ? ?Past Surgical History:  ?Procedure Laterality Date  ? CHOLECYSTECTOMY    ? HERNIA REPAIR    ? REPLACEMENT TOTAL KNEE BILATERAL  05/2019  ? ROTATOR CUFF REPAIR Right   ? VARICOSE VEIN SURGERY Left   ? 4 surgery  ? ?Past Surgical History:  ?Procedure Laterality Date  ? CHOLECYSTECTOMY    ? HERNIA REPAIR    ? REPLACEMENT TOTAL KNEE BILATERAL  05/2019  ? ROTATOR CUFF REPAIR Right   ? VARICOSE VEIN SURGERY Left   ? 4 surgery  ? ?Past Medical History:  ?Diagnosis Date  ? Hypertension   ? ?BP (!) 150/92   Pulse (!) 107   Ht 6\' 4"  (1.93 m)   Wt 266 lb 12.8 oz (121 kg)   SpO2 96%   BMI 32.48 kg/m?  ? ?Opioid Risk Score:   ?  Fall Risk Score:  `1 ? ?Depression screen PHQ 2/9 ? ? ?  01/10/2022  ?  1:51 PM 11/15/2021  ? 12:19 PM 05/19/2021  ?  1:19 PM 02/04/2021  ?  1:28 PM  ?Depression screen PHQ 2/9  ?Decreased Interest 1 1  3   ?Down, Depressed, Hopeless 1 1 3 1   ?PHQ - 2 Score 2 2 3 4   ?Altered sleeping   1 3  ?Tired, decreased energy    3  ?Change in appetite    0  ?Feeling bad or failure about yourself     2  ?Trouble concentrating    3  ?Moving slowly or fidgety/restless    3  ?Suicidal thoughts    0  ?PHQ-9 Score   4 18  ?Difficult doing work/chores    Very difficult  ?  ?Review of Systems  ?Musculoskeletal:   ?     Bilateral knee pain ?Bilateral lower leg pain  ?All other systems reviewed and are negative. ? ?   ?Objective:  ? Physical Exam ?Vitals and nursing note reviewed.  ?Constitutional:   ?   Appearance: He is obese.  ?HENT:  ?   Head:  Normocephalic and atraumatic.  ?Eyes:  ?   Extraocular Movements: Extraocular movements intact.  ?   Conjunctiva/sclera: Conjunctivae normal.  ?   Pupils: Pupils are equal, round, and reactive to light.  ?Musculoskeletal:  ?   Right knee: No swelling, effusion, erythema or ecchymosis. Decreased range of motion. No tenderness.  ?   Left knee: No swelling, effusion, erythema or ecchymosis. Normal range of motion.  ?Skin: ?   Comments: Stasis dermatitis right pretibial area  ?Neurological:  ?   Mental Status: He is alert and oriented to person, place, and time.  ?   Comments: Normal quad strength bilaterally ?Ambulates without assistive device no evidence of toe drag or knee instability  ? ? ? ? ? ?   ?Assessment & Plan:  ? ?1.  Chronic bilateral postoperative knee pain after total knee replacements and 2020.  The patient has had good results with genicular RF bilaterally.  He is now 6 months post right genicular RF pain is starting to return.  We will schedule in 6 weeks for repeat right genicular RF. ?We discussed that if he is having any problems with infection in the right lower extremity this will need to be postponed.  Notify this clinic if he has any other procedures related to blood flow in the right lower extremity. ?

## 2022-01-11 ENCOUNTER — Encounter: Payer: Self-pay | Admitting: Genetic Counselor

## 2022-01-11 DIAGNOSIS — Z8041 Family history of malignant neoplasm of ovary: Secondary | ICD-10-CM

## 2022-01-11 DIAGNOSIS — Z803 Family history of malignant neoplasm of breast: Secondary | ICD-10-CM

## 2022-01-11 DIAGNOSIS — Z1501 Genetic susceptibility to malignant neoplasm of breast: Secondary | ICD-10-CM

## 2022-01-11 DIAGNOSIS — Z800 Family history of malignant neoplasm of digestive organs: Secondary | ICD-10-CM

## 2022-01-11 HISTORY — DX: Genetic susceptibility to malignant neoplasm of breast: Z15.01

## 2022-01-11 HISTORY — DX: Family history of malignant neoplasm of breast: Z80.3

## 2022-01-11 HISTORY — DX: Family history of malignant neoplasm of ovary: Z80.41

## 2022-01-11 HISTORY — DX: Family history of malignant neoplasm of digestive organs: Z80.0

## 2022-01-11 NOTE — Progress Notes (Signed)
REFERRING PROVIDER: ?Sheela Stack, MD ?[address] ?[place],  [place] [address] ? ?PRIMARY PROVIDER:  ?Sheela Stack, MD ? ?PRIMARY REASON FOR VISIT:  ?1. Family history of Li-Fraumeni syndrome   ?2. Family history of breast cancer   ?3. Family history of ovarian cancer   ?4. Family history of colon cancer   ? ? ? ?HISTORY OF PRESENT ILLNESS:   ?Brandon Strickland, a 61 y.o. male, was seen for a [facility] cancer genetics consultation at the request of Dr. Radford Pax due to a family history of cancer and a family history of Li-Fraumeni syndrome.  Brandon Strickland presents to clinic today to discuss the possibility of a hereditary predisposition to cancer, to discuss genetic testing, and to further clarify his future cancer risks, as well as potential cancer risks for family members.  ? ?At the age of 66, Brandon Strickland was diagnosed with basal cell carcinoma on his face/lip. He does not report other personal history of cancer.  ? ? ? ?RISK FACTORS:  ?Colonoscopy: yes;  more than 20 years ago; normal Cologuard . ?Dermatology: as needed ? ?Past Medical History:  ?Diagnosis Date  ? Family history of breast cancer 01/11/2022  ? Family history of colon cancer 01/11/2022  ? Family history of Li-Fraumeni syndrome 01/11/2022  ? Family history of ovarian cancer 01/11/2022  ? Hypertension   ? ? ?Past Surgical History:  ?Procedure Laterality Date  ? CHOLECYSTECTOMY    ? HERNIA REPAIR    ? REPLACEMENT TOTAL KNEE BILATERAL  05/2019  ? ROTATOR CUFF REPAIR Right   ? VARICOSE VEIN SURGERY Left   ? 4 surgery  ? ? ?FAMILY HISTORY:  ?We obtained a detailed, 4-generation family history.  Significant diagnoses are listed below: ?Family History  ?Problem Relation Age of Onset  ? Breast cancer Mother 57  ? Breast cancer Maternal Aunt   ?     dx <50, x3 maternal aunts  ? Ovarian cancer Maternal Aunt   ?     dx <50  ? Colon cancer Maternal Uncle   ?     dx <50  ? Leukemia Maternal Uncle   ?     dx <50  ? Leukemia Paternal Uncle   ?     dx after  51  ? Breast cancer Cousin   ?     dx <50  ? Lung cancer Cousin   ?     dx <50  ? Brain cancer Cousin   ?     dx <50  ? Cancer - Other Cousin   ?     sarcoma/thigh; ovarian pre cancer? dx <50  ? Li-Fraumeni syndrome Cousin   ? Throat cancer Cousin   ? Colon cancer Cousin   ? Brain cancer Cousin   ?     d. 6 months  ? ? ? ?Brandon Strickland reported a TP53 mutation in his double first cousin, Secundino Ginger, who had a history of a several Li-Fraumeni-related cancers.  No report was available for review today. Brandon Strickland is unaware of other previous family history of genetic testing for hereditary cancer risks. There is no reported Ashkenazi Jewish ancestry. There is no known consanguinity. ? ?GENETIC COUNSELING ASSESSMENT: Brandon Strickland is a 61 y.o. male with a family history of known hereditary cancer syndrome and a family history of cancer which could be suggestive of an additional hereditary cancer syndrome.  We, therefore, discussed and recommended the following at today's visit.  ? ?DISCUSSION: We discussed that 5 -  10% of cancer is hereditary.  We discussed that given his double first cousin had a TP53 mutation, he has approximately a 20% chance, or less, of also having the same mutation.  We discussed cancer risks associated with Li-Fraumeni syndrome, appropriate management, and family implications. There are other genes that can be associated with hereditary breast and ovarian cancer syndromes.  These include but are not limited to BRCA1/2.  We discussed that testing is beneficial for several reasons, including knowing about other cancer risks, identifying potential screening and risk-reduction options that may be appropriate, and to understanding if other family members could be at risk for cancer and allowing them to undergo genetic testing. ? ?We reviewed the characteristics, features and inheritance patterns of hereditary cancer syndromes. We also discussed genetic testing, including the appropriate family members to test,  the process of testing, insurance coverage and turn-around-time for results. We discussed the implications of a negative, positive, carrier and/or variant of uncertain significant result. We recommended Brandon Strickland pursue genetic testing for a panel that contains TP53 as well as other genes associated with breast, ovarian, and colon cancers. ? ?The CancerNext-Expanded gene panel offered by [facility] and includes sequencing, rearrangement, and RNA analysis for the following 77 genes: AIP, ALK, APC, ATM, AXIN2, BAP1, BARD1, BLM, BMPR1A, BRCA1, BRCA2, BRIP1, CDC73, CDH1, CDK4, CDKN1B, CDKN2A, CHEK2, CTNNA1, DICER1, FANCC, FH, FLCN, GALNT12, KIF1B, LZTR1, MAX, MEN1, MET, MLH1, MSH2, MSH3, MSH6, MUTYH, NBN, NF1, NF2, NTHL1, PALB2, PHOX2B, PMS2, POT1, PRKAR1A, PTCH1, PTEN, RAD51C, RAD51D, RB1, RECQL, RET, SDHA, SDHAF2, SDHB, SDHC, SDHD, SMAD4, SMARCA4, SMARCB1, SMARCE1, STK11, SUFU, TMEM127, TP53, TSC1, TSC2, VHL and XRCC2 (sequencing and deletion/duplication); EGFR, EGLN1, HOXB13, KIT, MITF, PDGFRA, POLD1, and POLE (sequencing only); EPCAM and GREM1 (deletion/duplication only).  ? ?Based on Brandon Strickland's family history of cancer and family history of a TP53 mutation, he meets medical criteria for genetic testing.  ? ?We discussed that some people do not want to undergo genetic testing due to fear of genetic discrimination.  A federal law called the Genetic Information Non-Discrimination Act (GINA) of 2008 helps protect individuals against genetic discrimination based on their genetic test results.  It impacts both health insurance and employment.  With health insurance, it protects against increased premiums, being kicked off insurance or being forced to take a test in order to be insured.  For employment it protects against hiring, firing and promoting decisions based on genetic test results.  GINA does not apply to those in the [employer], those who work for companies with less than 15 employees, and new life  insurance or long-term disability insurance policies.  Health status due to a cancer diagnosis is not protected under GINA. ? ?PLAN: After considering the risks, benefits, and limitations, Brandon Strickland provided informed consent to pursue genetic testing and the blood sample was sent to [facility] for analysis of the CancerNext-Expanded +RNAinsight Panel. Results should be available within approximately 3 weeks' time, at which point they will be disclosed by telephone to Brandon Strickland, as will any additional recommendations warranted by these results. Brandon Strickland will receive a summary of his genetic counseling visit and a copy of his results once available. This information will also be available in Epic.  ? ? ?Brandon Strickland's questions were answered to his satisfaction today. Our contact information was provided should additional questions or concerns arise. Thank you for the referral and allowing [place] to share in the care of your patient.  ? ?Lamaj Metoyer M. Joette Catching, [place], [place] ?Genetic Counselor ?  [email or web address] ?([phone]  ? ?The patient was seen for a total of 40 minutes in face-to-face genetic counseling.  Patient was accompanied by his wife, Joycelyn Schmid.  Drs. Lindi Adie and/or Burr Medico were available to discuss this case as needed.  ?_______________________________________________________________________ ?For Office Staff:  ?Number of people involved in session: 2 ?Was an Intern/ student involved with case: no ? ? ?

## 2022-01-16 ENCOUNTER — Encounter (HOSPITAL_COMMUNITY): Payer: Self-pay

## 2022-01-16 ENCOUNTER — Ambulatory Visit (HOSPITAL_COMMUNITY): Admission: RE | Admit: 2022-01-16 | Payer: No Typology Code available for payment source | Source: Ambulatory Visit

## 2022-01-26 ENCOUNTER — Ambulatory Visit: Payer: Self-pay | Admitting: Genetic Counselor

## 2022-01-26 ENCOUNTER — Telehealth: Payer: Self-pay | Admitting: Genetic Counselor

## 2022-01-26 ENCOUNTER — Encounter: Payer: Self-pay | Admitting: Genetic Counselor

## 2022-01-26 DIAGNOSIS — Z1379 Encounter for other screening for genetic and chromosomal anomalies: Secondary | ICD-10-CM

## 2022-01-26 DIAGNOSIS — Z803 Family history of malignant neoplasm of breast: Secondary | ICD-10-CM

## 2022-01-26 DIAGNOSIS — Z1501 Genetic susceptibility to malignant neoplasm of breast: Secondary | ICD-10-CM

## 2022-01-26 DIAGNOSIS — Z8041 Family history of malignant neoplasm of ovary: Secondary | ICD-10-CM

## 2022-01-26 DIAGNOSIS — Z800 Family history of malignant neoplasm of digestive organs: Secondary | ICD-10-CM

## 2022-01-26 NOTE — Progress Notes (Signed)
HPI:   Brandon Strickland was previously seen in the [place] clinic due to a family history of cancer, a family history of LiFraumeni Syndrome, and concerns regarding a hereditary predisposition to cancer. Please refer to our prior cancer genetics clinic note for more information regarding our discussion, assessment and recommendations, at the time. Brandon Strickland recent genetic test results were disclosed to him, as were recommendations warranted by these results. These results and recommendations are discussed in more detail below.  CANCER HISTORY:   At the age of 61, Brandon Strickland was diagnosed with basal cell carcinoma on his face/lip. He does not report other personal history of cancer.    FAMILY HISTORY:  We obtained a detailed, 4-generation family history.  Significant diagnoses are listed below: Family History  Problem Relation Age of Onset   Breast cancer Mother 63   Breast cancer Maternal Aunt        dx <50, x3 maternal aunts   Ovarian cancer Maternal Aunt        dx <50   Colon cancer Maternal Uncle        dx <50   Leukemia Maternal Uncle        dx <50   Leukemia Paternal Uncle        dx after 85   Breast cancer Cousin        dx <50   Lung cancer Cousin        dx <50   Brain cancer Cousin        dx <50   Cancer - Other Cousin        sarcoma/thigh; ovarian pre cancer? dx <50   Li-Fraumeni syndrome Cousin    Throat cancer Cousin    Colon cancer Cousin    Brain cancer Cousin        d. 6 months      Brandon Strickland reported a TP53 mutation in his double first cousin, Secundino Ginger, who had a history of a several Li-Fraumeni-related cancers.  No report was available for review today. Brandon Strickland is unaware of other previous family history of genetic testing for hereditary cancer risks. There is no reported Ashkenazi Jewish ancestry. There is no known consanguinity.   GENETIC TEST RESULTS:  The [facility] CancerNext-Expanded +RNAinsight Panel found no pathogenic mutations. The  CancerNext-Expanded gene panel offered by [facility] and includes sequencing, rearrangement, and RNA analysis for the following 77 genes: AIP, ALK, APC, ATM, AXIN2, BAP1, BARD1, BLM, BMPR1A, BRCA1, BRCA2, BRIP1, CDC73, CDH1, CDK4, CDKN1B, CDKN2A, CHEK2, CTNNA1, DICER1, FANCC, FH, FLCN, GALNT12, KIF1B, LZTR1, MAX, MEN1, MET, MLH1, MSH2, MSH3, MSH6, MUTYH, NBN, NF1, NF2, NTHL1, PALB2, PHOX2B, PMS2, POT1, PRKAR1A, PTCH1, PTEN, RAD51C, RAD51D, RB1, RECQL, RET, SDHA, SDHAF2, SDHB, SDHC, SDHD, SMAD4, SMARCA4, SMARCB1, SMARCE1, STK11, SUFU, TMEM127, TP53, TSC1, TSC2, VHL and XRCC2 (sequencing and deletion/duplication); EGFR, EGLN1, HOXB13, KIT, MITF, PDGFRA, POLD1, and POLE (sequencing only); EPCAM and GREM1 (deletion/duplication only).   The test report has been scanned into EPIC and is located under the Molecular Pathology section of the Results Review tab.  A portion of the result report is included below for reference. Genetic testing reported out on Jan 24, 2022.     We recommended Brandon Strickland pursue testing for TP53 gene given that he reported his cousin has a mutation in TP53 and a history of several Li-Fraumeni-related cancers. Brandon Strickland test was normal and did not reveal any mutation in . We call this result a true negative result because the  cancer-causing mutation was identified in Brandon Strickland's family, and he did not inherit it.  Given this negative result, Brandon Strickland chances of developing Li-Fraumeni-related cancers are the same as they are in the general population.  We requested that Brandon Strickland provides [place] with a copy of his family member's positive genetic testing report so we can ensure the lab would be able to detect this mutation if present in his sample.  No report was available at the time of testing.   Possible explanations for the cancer in the family may include: It is probable that other family members, beyond his cousin, Secundino Ginger, had Li-Fraumeni syndrome, which explained their  history of cancer. It is also possible there is another hereditary cause for the cancer, beyond Li-Fraumeni syndrome, in the family that Brandon Strickland did not inherit. The non-Li-Fraumeni cancers in Brandon Strickland's family may be sporadic/familial or due to other genetic and environmental factors. There may be a gene mutation in one of these genes that current testing methods cannot detect but that chance is small. There could be another gene that has not yet been discovered, or that we have not yet tested, that is responsible for the cancer diagnoses in the family.  Therefore, it is important to remain in touch with cancer genetics in the future so that we can continue to offer Brandon Strickland the most up to date genetic testing.    ADDITIONAL GENETIC TESTING:  We discussed with Brandon Strickland that his genetic testing was fairly extensive.  If there are additional relevant genes identified to increase cancer risk that can be analyzed in the future, we would be happy to discuss and coordinate this testing at that time.    CANCER SCREENING RECOMMENDATIONS:  Brandon Strickland test result is considered negative (normal).  This means that we have not identified the hereditary cause for his family history of cancer in his sample.   An individual's cancer risk and medical management are not determined by genetic test results alone. Overall cancer risk assessment incorporates additional factors, including personal medical history, family history, and any available genetic information that may result in a personalized plan for cancer prevention and surveillance. Therefore, it is recommended he continue to follow the cancer management and screening guidelines provided by his primary healthcare provider.  RECOMMENDATIONS FOR FAMILY MEMBERS:   Since he did not inherit a identifiable mutation in a cancer predisposition gene included on this panel, his children could not have inherited a known mutation from him in one of these  genes.  Genetic testing for Li-Fraumeni syndrome is not indicated in his children based on his family history given his negative result.  Individuals in this family might be at some increased risk of developing cancer, over the general population risk, due to the family history of cancer.  Individuals in the family should notify their providers of the family history of cancer. We recommend women in this family have a yearly mammogram beginning at age 66, or 51 years younger than the earliest onset of cancer, an annual clinical breast exam, and perform monthly breast self-exams.   Other members of the family may still carry a pathogenic variant in one of these genes that Mr. Mesick did not inherit. Based on the family history, we recommend relatives of his cousin, Secundino Ginger, who was diagnosed with Li-Fraumeni syndrome, have genetic counseling/testing.    FOLLOW-UP:  Lastly, we discussed with Mr. Servellon that cancer genetics is a rapidly advancing field and it is possible that new genetic  tests will be appropriate for him and/or his family members in the future. We encouraged him to remain in contact with cancer genetics on an annual basis so we can update his personal and family histories and let him know of advances in cancer genetics that may benefit this family.   Our contact number was provided. Mr. Wahlen questions were answered to his satisfaction, and he knows he is welcome to call us at anytime with additional questions or concerns.   Tonie Vizcarrondo M. Joette Catching, [place], [facility] Genetic Counselor [email or web address] [phone]) [phone]

## 2022-01-26 NOTE — Telephone Encounter (Signed)
Revealed negative genetic testing.  Variant in TP53 (which was previously reported in family) was not detected in Brandon Strickland.  Risks of Li-Fraumeni related cancers are about the same as the general population.

## 2022-01-27 ENCOUNTER — Ambulatory Visit (HOSPITAL_COMMUNITY)
Admission: RE | Admit: 2022-01-27 | Discharge: 2022-01-27 | Disposition: A | Discharge status: Home / Self Care | Payer: No Typology Code available for payment source | Source: Ambulatory Visit | Attending: Internal Medicine | Admitting: Internal Medicine

## 2022-01-27 ENCOUNTER — Other Ambulatory Visit (HOSPITAL_COMMUNITY): Payer: Self-pay | Admitting: Internal Medicine

## 2022-01-27 DIAGNOSIS — I83893 Varicose veins of bilateral lower extremities with other complications: Secondary | ICD-10-CM | POA: Diagnosis not present

## 2022-02-23 ENCOUNTER — Ambulatory Visit: Payer: No Typology Code available for payment source | Admitting: Physical Medicine & Rehabilitation

## 2022-05-11 ENCOUNTER — Encounter: Payer: Self-pay | Admitting: Surgery

## 2022-05-11 ENCOUNTER — Ambulatory Visit (INDEPENDENT_AMBULATORY_CARE_PROVIDER_SITE_OTHER): Payer: No Typology Code available for payment source | Admitting: Physician Assistant

## 2022-05-11 VITALS — BP 129/79 | HR 105 | Temp 98.2°F | Resp 20 | Ht 76.0 in | Wt 280.3 lb

## 2022-05-11 DIAGNOSIS — I872 Venous insufficiency (chronic) (peripheral): Secondary | ICD-10-CM | POA: Diagnosis not present

## 2022-05-11 NOTE — Progress Notes (Signed)
[facility] [place]   Reason for referral: Swollen B legs  History of Present Illness  Brandon Strickland is a 61 y.o. male who presents with chief complaint: Bilateral swollen legs.  Patient notes, onset of swelling >4 years ago, associated with chronic venous reflux.  He has had vein interventions on the left LE x 4.  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  He has been placed on PCN po for life to prevent infection.   He states this includes The patient has had a history of left LE DVT, positive history of varicose vein, mixed history of venous stasis ulcers, no history of  Lymphedema and positive history of skin changes in lower legs.  There is unknown family history of venous disorders.  The patient has  used compression 20-30 mmhg thigh high stockings for the past 4 + years.     Past Medical History:  Diagnosis Date   Family history of breast cancer 01/11/2022   Family history of colon cancer 01/11/2022   Family history of Li-Fraumeni syndrome 01/11/2022   Family history of ovarian cancer 01/11/2022   Hypertension     Past Surgical History:  Procedure Laterality Date   CHOLECYSTECTOMY     HERNIA REPAIR     REPLACEMENT TOTAL KNEE BILATERAL  05/2019   ROTATOR CUFF REPAIR Right    VARICOSE VEIN SURGERY Left    4 surgery    Social History   Socioeconomic History   Marital status: Married    Spouse name: Not on file   Number of children: Not on file   Years of education: Not on file   Highest education level: Not on file  Occupational History   Not on file  Tobacco Use   Smoking status: Former    Types: Cigarettes    Quit date: 01/03/2012    Years since quitting: 10.3    Passive exposure: Never   Smokeless tobacco: Never  Vaping Use   Vaping Use: Never used  Substance and Sexual Activity   Alcohol use: Yes    Alcohol/week: 3.0 standard drinks of alcohol    Types: 3 Cans of beer per week   Drug use:  Never   Sexual activity: Not on file  Other Topics Concern   Not on file  Social History Narrative   Not on file   Social Determinants of Health   Financial Resource Strain: Not on file  Food Insecurity: Not on file  Transportation Needs: Not on file  Physical Activity: Not on file  Stress: Not on file  Social Connections: Not on file  Intimate Partner Violence: Not on file    Family History  Problem Relation Age of Onset   Diabetes Mellitus II Mother    Breast cancer Mother 20   Breast cancer Maternal Aunt        dx <50, x3 maternal aunts   Ovarian cancer Maternal Aunt        dx <50   Colon cancer Maternal Uncle        dx <50   Leukemia Maternal Uncle        dx <50   Leukemia Paternal Uncle        dx after 7   Breast cancer Cousin        dx <50   Lung cancer Cousin        dx <50   Brain cancer Cousin  dx <50   Cancer - Other Cousin        sarcoma/thigh; ovarian pre cancer? dx <50   Li-Fraumeni syndrome Cousin    Throat cancer Cousin    Colon cancer Cousin    Brain cancer Cousin        d. 6 months    Current Outpatient Medications on File Prior to Visit  Medication Sig Dispense Refill   ascorbic acid (VITAMIN C) 250 MG tablet Take 250 mg by mouth daily.     Cholecalciferol 25 MCG (1000 UT) tablet Take 1,000 Units by mouth daily.     clobetasol ointment (TEMOVATE) 0.05 % Apply topically.     diazepam (VALIUM) 10 MG tablet      DULoxetine (CYMBALTA) 60 MG capsule TAKE ONE CAPSULE BY MOUTH EVERY MORNING FOR MOOD, ANXIETY AND PAIN     famotidine (PEPCID) 20 MG tablet Take 20 mg by mouth daily.     lisinopril-hydrochlorothiazide (ZESTORETIC) 10-12.5 MG tablet Take 1 tablet by mouth daily.     loratadine (CLARITIN) 10 MG tablet Take 10 mg by mouth daily.     melatonin 3 MG TABS tablet TAKE 3 TABLETS/CAPSULES (9MG ) BY MOUTH AT BEDTIME FOR INSOMNIA     mupirocin cream (BACTROBAN) 2 % Apply topically 2 (two) times daily. 15 g 0   penicillin v potassium  (VEETID) 500 MG tablet TAKE ONE-HALF TABLET BY MOUTH TWICE A DAY PREVENTION OF CELLULITIS FOR PREVENTION OF CELLULITIS (DOSE IS 250MG )     No current facility-administered medications on file prior to visit.    Allergies as of 05/11/2022 - Review Complete 05/11/2022  Allergen Reaction Noted   Latex Rash 06/13/2012     ROS:   General:  No weight loss, Fever, chills  HEENT: No recent headaches, no nasal bleeding, no visual changes, no sore throat  Neurologic: No dizziness, blackouts, seizures. No recent symptoms of stroke or mini- stroke. No recent episodes of slurred speech, or temporary blindness.  Cardiac: No recent episodes of chest pain/pressure, no shortness of breath at rest.  No shortness of breath with exertion.  Denies history of atrial fibrillation or irregular heartbeat  Vascular: No history of rest pain in feet.  No history of claudication.  No history of non-healing ulcer, positive history of DVT   Pulmonary: No home oxygen, no productive cough, no hemoptysis,  No asthma or wheezing  Musculoskeletal:  [ ]  Arthritis, [ ]  Low back pain,  [ x] Joint pain  Hematologic:No history of hypercoagulable state.  No history of easy bleeding.  No history of anemia  Gastrointestinal: No hematochezia or melena,  No gastroesophageal reflux, no trouble swallowing  Urinary: [ ]  chronic Kidney disease, [ ]  on HD - [ ]  MWF or [ ]  TTHS, [ ]  Burning with urination, [ ]  Frequent urination, [ ]  Difficulty urinating;   Skin: No rashes  Psychological: No history of anxiety,  No history of depression  Physical Examination  Vitals:   05/11/22 1314  BP: 129/79  Pulse: (!) 105  Resp: 20  Temp: 98.2 F (36.8 C)  TempSrc: Temporal  SpO2: 96%  Weight: 280 lb 4.8 oz (127.1 kg)  Height: 6\' 4"  (1.93 m)    Body mass index is 34.12 kg/m.  General:  Alert and oriented, no acute distress HEENT: Normal Neck: No bruit or JVD Pulmonary: Clear to auscultation bilaterally Cardiac: Regular  Rate and Rhythm without murmur Abdomen: Soft, non-tender, non-distended, no mass, no scars Skin: No rash     Extremity  Pulses: radial, femoral, dorsalis pedis, pulses bilaterally Musculoskeletal: No deformity or edema  Neurologic: Upper and lower extremity motor 5/5 and symmetric  DATA: Venous Reflux Times RIGHT Reflux No Reflux Yes Reflux Time Diameter cms Comments CFV yes >1 second FV prox yes >1 second FV mid yes >1 second FV dist yes >1 second Popliteal yes >1 second GSV at [facility] yes >500 ms 0.672 GSV prox thigh yes >500 ms 0.530 GSV mid thigh yes >500 ms 0.458 GSV dist thigh yes >500 ms 0.529 GSV at knee yes >500 ms 0.556 GSV prox calf 0.487 GSV mid calf 0.475 SSV Pop Fossa yes >500 ms 0.643 SSV prox calf yes >500 ms 0.466 SSV mid calf 0.413 Final Kol Sunny Schlein [id number] 01/27/2022 LEFT Reflux No Reflux Yes Reflux Time Diameter cms Comments CFV yes >1 second FV prox yes >1 second FV mid yes >1 second FV dist yes >1 second Large IPV, 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 - No evidence of superficial venous thrombosis in the right lower extremity. - Venous reflux is noted in the right common femoral vein. - Venous reflux is noted in the right sapheno-femoral junction. - Venous reflux is noted in the right greater saphenous vein in the calf. - Venous reflux is noted in the right femoral vein. - Venous reflux is noted in the right popliteal vein. - Venous reflux is noted  in the right short saphenous vein. Left: - No evidence of superficial venous reflux seen in the left short saphenous vein. - Color duplex evaluation of the left lower extremity shows there is thrombus in the lesser saphenous vein. - Venous reflux is noted in the left common femoral vein. - Venous reflux is noted in the left greater saphenous vein in the calf. - Venous reflux is noted in the left femoral vein. - Venous reflux is noted in the left short saphenous vein. - Venous reflux is noted in the left perforator vein. - No thigh GSV identified consistent with previous stripping. Small saphenous is incompetent with  Final Eustaquio Boyden [id number] 01/27/2022 recanalized chronic thrombus. 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 - Venous reflux is noted in the right sapheno-femoral junction. - Venous reflux is noted in the right greater saphenous vein in the calf. - Venous reflux is noted in the right femoral vein. - Venous reflux is noted in the right popliteal vein. - Venous reflux is noted in the right short saphenous vein. The GSV vein size is > 0.4 cm down to the knee.     Plan: Do to his chronic venous insufficieny, mixed with  pyoderma Gangrenosum/ venous wounds I will have him f/u with our vein clinic for consideration of laser ablation. He will continue his thigh high compression 20-30 mm hg daily, elevation  when at rest.    He has complaints of pain which could possible be caused by edema, but the vein intervention may not relieve all of his pain symptoms.  He understands and wishes to move forward.   Mosetta Pigeon PA-C [facility] [place] Office: [phone]  MD on call  Lenell Antu

## 2022-05-29 ENCOUNTER — Encounter: Payer: Self-pay | Admitting: Surgery

## 2022-05-29 ENCOUNTER — Ambulatory Visit (INDEPENDENT_AMBULATORY_CARE_PROVIDER_SITE_OTHER): Payer: No Typology Code available for payment source | Admitting: Surgery

## 2022-05-29 VITALS — BP 109/62 | HR 109 | Temp 97.9°F | Resp 20 | Ht 76.0 in | Wt 282.0 lb

## 2022-05-29 DIAGNOSIS — I83893 Varicose veins of bilateral lower extremities with other complications: Secondary | ICD-10-CM | POA: Diagnosis not present

## 2022-05-29 NOTE — Progress Notes (Signed)
[facility]  Patient name: Brandon Strickland MRN: [id number] DOB: 08/19/1961 Sex: male   REASON FOR VISIT:    Follow up  [place]:    Brandon Strickland is a 61 y.o. male comes back in today for follow-up of bilateral swollen legs which began greater than 4 years ago.  He has had previous interventions x4 in the left leg, which he describes as vein stripping type procedures..  He has been diagnosed with pyoderma gangrenosum via biopsy.  He does not have active ulcers.  He has a history of left leg DVT he has been wearing 20-30 thigh-high compression stockings.  He has significant skin discoloration bilaterally as well as bilateral edema.  He has undergone bilateral knee replacements and has to use a walker to walk because of the challenges.  He is medically managed for hypertension.  He is a former smoker.     PAST MEDICAL HISTORY:   Past Medical History:  Diagnosis Date   Family history of breast cancer 01/11/2022   Family history of colon cancer 01/11/2022   Family history of Li-Fraumeni syndrome 01/11/2022   Family history of ovarian cancer 01/11/2022   Hypertension      FAMILY HISTORY:   Family History  Problem Relation Age of Onset   Diabetes Mellitus II Mother    Breast cancer Mother 65   Breast cancer Maternal Aunt        dx <50, x3 maternal aunts   Ovarian cancer Maternal Aunt        dx <50   Colon cancer Maternal Uncle        dx <50   Leukemia Maternal Uncle        dx <50   Leukemia Paternal Uncle        dx after 55   Breast cancer Cousin        dx <50   Lung cancer Cousin        dx <50   Brain cancer Cousin        dx <50   Cancer - Other Cousin        sarcoma/thigh; ovarian pre cancer? dx <50   Li-Fraumeni syndrome Cousin    Throat cancer Cousin    Colon cancer Cousin    Brain cancer Cousin        d. 6 months    SOCIAL HISTORY:   Social History   Tobacco Use   Smoking  status: Former    Types: Cigarettes    Quit date: 01/03/2012    Years since quitting: 10.4    Passive exposure: Never   Smokeless tobacco: Never  Substance Use Topics   Alcohol use: Yes    Alcohol/week: 3.0 standard drinks of alcohol    Types: 3 Cans of beer per week     ALLERGIES:   Allergies  Allergen Reactions   Latex Rash    Other reaction(s): rash/itching     CURRENT MEDICATIONS:   Current Outpatient Medications  Medication Sig Dispense Refill   ascorbic acid (VITAMIN C) 250 MG tablet Take 250 mg by mouth daily.     Cholecalciferol 25 MCG (1000 UT) tablet Take 1,000 Units by mouth daily.     clobetasol ointment (TEMOVATE) 0.05 % Apply topically.     diazepam (VALIUM) 10 MG tablet      DULoxetine (CYMBALTA) 60 MG capsule TAKE ONE CAPSULE BY MOUTH EVERY MORNING FOR MOOD, ANXIETY AND PAIN     famotidine (PEPCID)  20 MG tablet Take 20 mg by mouth daily.     lisinopril-hydrochlorothiazide (ZESTORETIC) 10-12.5 MG tablet Take 1 tablet by mouth daily.     loratadine (CLARITIN) 10 MG tablet Take 10 mg by mouth daily.     melatonin 3 MG TABS tablet TAKE 3 TABLETS/CAPSULES (9MG ) BY MOUTH AT BEDTIME FOR INSOMNIA     mupirocin cream (BACTROBAN) 2 % Apply topically 2 (two) times daily. 15 g 0   penicillin v potassium (VEETID) 500 MG tablet TAKE ONE-HALF TABLET BY MOUTH TWICE A DAY PREVENTION OF CELLULITIS FOR PREVENTION OF CELLULITIS (DOSE IS 250MG )     No current facility-administered medications for this visit.    REVIEW OF SYSTEMS:   [X]  denotes positive finding, [ ]  denotes negative finding Cardiac  Comments:  Chest pain or chest pressure:    Shortness of breath upon exertion:    Short of breath when lying flat:    Irregular heart rhythm:        Vascular    Pain in calf, thigh, or hip brought on by ambulation:    Pain in feet at night that wakes you up from your sleep:     Blood clot in your veins:    Leg swelling:  x       Pulmonary    Oxygen at home:    Productive  cough:     Wheezing:         Neurologic    Sudden weakness in arms or legs:     Sudden numbness in arms or legs:     Sudden onset of difficulty speaking or slurred speech:    Temporary loss of vision in one eye:     Problems with dizziness:         Gastrointestinal    Blood in stool:     Vomited blood:         Genitourinary    Burning when urinating:     Blood in urine:        Psychiatric    Major depression:         Hematologic    Bleeding problems:    Problems with blood clotting too easily:        Skin    Rashes or ulcers:        Constitutional    Fever or chills:      PHYSICAL EXAM:   Vitals:   05/29/22 1415  BP: 109/62  Pulse: (!) 109  Resp: 20  Temp: 97.9 F (36.6 C)  SpO2: 94%  Weight: 282 lb (127.9 kg)  Height: 6\' 4"  (1.93 m)    GENERAL: The patient is a well-nourished male, in no acute distress. The vital signs are documented above. CARDIAC: There is a regular rate and rhythm.  VASCULAR: SonoSite was used to evaluate the right saphenous vein which is dilated and straight with multiple branches.  There is several prominent varicosities along the medial calf of the right leg PULMONARY: Non-labored respirations MUSCULOSKELETAL: There are no major deformities or cyanosis. NEUROLOGIC: No focal weakness or paresthesias are detected. SKIN: Significant skin discoloration bilaterally PSYCHIATRIC: The patient has a normal affect.  STUDIES:   I have reviewed the following reflux study: Venous Reflux Times  +--------------+---------+------+-----------+------------+--------+  RIGHT         Reflux NoRefluxReflux TimeDiameter cmsComments                          Yes                                   +--------------+---------+------+-----------+------------+--------+  CFV                     yes   >1 second                       +--------------+---------+------+-----------+------------+--------+  FV prox                 yes   >1  second                       +--------------+---------+------+-----------+------------+--------+  FV mid                  yes   >1 second                       +--------------+---------+------+-----------+------------+--------+  FV dist                 yes   >1 second                       +--------------+---------+------+-----------+------------+--------+  Popliteal               yes   >1 second                       +--------------+---------+------+-----------+------------+--------+  GSV at [facility]              yes    >500 ms     0.672              +--------------+---------+------+-----------+------------+--------+  GSV prox thigh          yes    >500 ms     0.530              +--------------+---------+------+-----------+------------+--------+  GSV mid thigh           yes    >500 ms     0.458              +--------------+---------+------+-----------+------------+--------+  GSV dist thigh          yes    >500 ms     0.529              +--------------+---------+------+-----------+------------+--------+  GSV at knee             yes    >500 ms     0.556              +--------------+---------+------+-----------+------------+--------+  GSV prox calf                              0.487              +--------------+---------+------+-----------+------------+--------+  GSV mid calf                               0.475              +--------------+---------+------+-----------+------------+--------+  SSV Pop Fossa           yes    >500 ms     0.643              +--------------+---------+------+-----------+------------+--------+  SSV prox calf           yes    >500 ms     0.466              +--------------+---------+------+-----------+------------+--------+  SSV mid calf                               0.413              +--------------+---------+------+-----------+------------+--------+       +--------------+---------+------+-----------+------------+-----------------  ----+  LEFT          Reflux NoRefluxReflux TimeDiameter cmsComments                                        Yes                                                 +--------------+---------+------+-----------+------------+-----------------  ----+  CFV                     yes   >1 second                                     +--------------+---------+------+-----------+------------+-----------------  ----+  FV prox                 yes   >1 second                                     +--------------+---------+------+-----------+------------+-----------------  ----+  FV mid                  yes   >1 second                                     +--------------+---------+------+-----------+------------+-----------------  ----+  FV dist                 yes   >1 second             Large IPV,  source of                                                       BVV in calf              +--------------+---------+------+-----------+------------+-----------------  ----+  Popliteal     no                                                            +--------------+---------+------+-----------+------------+-----------------  ----+  GSV at [facility]                                          prior  ablation/stripping     +--------------+---------+------+-----------+------------+-----------------  ----+  GSV prox thigh                                      prior                                                                          ablation/stripping     +--------------+---------+------+-----------+------------+-----------------  ----+  GSV mid thigh                                       prior                                                                           ablation/stripping     +--------------+---------+------+-----------+------------+-----------------  ----+  GSV dist thigh                                      prior                                                                          ablation/stripping     +--------------+---------+------+-----------+------------+-----------------  ----+  GSV at knee                                         prior                                                                          ablation/stripping     +--------------+---------+------+-----------+------------+-----------------  ----+  GSV prox calf           yes    >500 ms     0.301                            +--------------+---------+------+-----------+------------+-----------------  ----+  GSV mid calf                               0.246                            +--------------+---------+------+-----------+------------+-----------------  ----+  SSV Pop Fossa no                           0.140    chronic thrombus        +--------------+---------+------+-----------+------------+-----------------  ----+  SSV prox calf           yes    >500 ms     0.216    chronic thrombus        +--------------+---------+------+-----------+------------+-----------------  ----+  SSV mid calf                               0.239                            +--------------+---------+------+-----------+------------+-----------------  ----+   MEDICAL ISSUES:   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  He does not have superficial reflux on the left.  He is never had a procedure on the right leg and has both superficial and deep vein reflux.  He is struggling with hyperpigmentation as well as edema and pain in the right leg.  His pain is multifactorial.  I discussed that we could proceed with laser ablation of the right saphenous vein and 10-20  stabs that should improve his symptoms however this will not resolve them.  He will still need to wear compression socks to prevent further long-term complications.  He understands this and wants to proceed.  We will work on Biochemist, clinical.    Annamarie Major, Dorothy Puffer, MD, FACS [facility] [phone] Pager [phone]

## 2022-09-25 ENCOUNTER — Encounter: Payer: Self-pay | Admitting: Nutrition

## 2022-09-25 ENCOUNTER — Encounter: Payer: No Typology Code available for payment source | Attending: Hospitalist | Admitting: Nutrition

## 2022-09-25 VITALS — Ht 76.0 in | Wt 286.0 lb

## 2022-09-25 DIAGNOSIS — R635 Abnormal weight gain: Secondary | ICD-10-CM | POA: Diagnosis present

## 2022-09-25 DIAGNOSIS — E669 Obesity, unspecified: Secondary | ICD-10-CM | POA: Insufficient documentation

## 2022-09-25 DIAGNOSIS — I10 Essential (primary) hypertension: Secondary | ICD-10-CM | POA: Diagnosis present

## 2022-09-25 NOTE — Patient Instructions (Signed)
Goals  Cut out snacking between meals Cut out processed foods, dairy and beef and pork. Increase whole grains, fruits, and vegetables. Drink a gallon of water per day Eat meals on time: B) 8 am, L) 12-2 D)5-7 pm.  Avoid eating after  7pm. Find hobbies or activities to do between meals or at night instead of snacking.

## 2022-09-25 NOTE — Progress Notes (Signed)
Medical Nutrition Therapy  Appointment Start time:  [phone]  Appointment End time:  1015  Primary concerns today: Obesity, weight gain  Referral diagnosis: E66.9,  Preferred learning style: No preference  Learning readiness: Ready    NUTRITION ASSESSMENT  58 y old wmale here with his wife. He desires weight loss. Complains of gaining weight over the last year. Has had multiple medical issues that have caused him to be much more sedentary that he use to be.  PMH: Obesity, Hyperlipidemia, HTN. Depression,panic disorder, OSA, GERD. He uses a walker today. He is unsteady on his feet. He has a strong cough today and gets strangled easily due to warmer temperature in room. Had to leave room a few times to get fresh cooler air. Had a Knee replacements 2020,  Gained 36 lbs since September 2022. Admits to not being able to do much and watching TV mostly and snacking. Realizes he eats when he is bored or just a habit. Snacks are usually processed chips and junk food rather than fruit or vegetables. Drinks some water but not a lot.    He is willing to work on Lifestyle Medicine to help provide needed weight loss and reduce  and avoid complications of other medical conditions.   Anthropometrics  Wt Readings from Last 3 Encounters:  05/29/22 282 lb (127.9 kg)  05/11/22 280 lb 4.8 oz (127.1 kg)  01/10/22 266 lb 12.8 oz (121 kg)   Ht Readings from Last 3 Encounters:  05/29/22 6\' 4"  (1.93 m)  05/11/22 6\' 4"  (1.93 m)  01/10/22 6\' 4"  (1.93 m)   There is no height or weight on file to calculate BMI. @BMIFA @ Facility age limit for growth %iles is 20 years. Facility age limit for growth %iles is 20 years.    Clinical Medical Hx:  See chart. Medications: see chart Labs:  A1C 5.2% 10/23;  Notable Signs/Symptoms: SOB when walking, tighter fitting clothes,   Lifestyle & Dietary Hx LIves with his wife. His wife does the cooking and shopping.   Estimated daily fluid intake: 90 oz Supplements: Vit   C and Vit D Sleep: poor Stress / self-care: health, and limited physical limitations Current average weekly physical activity: Not ambulatory  24-Hr Dietary Recall First Meal: 10 am Cherrios with banana, 1 -1/2 - c, lactose lf milk, banana 1/2 Snack: misc chips or cookies sometimes Second Meal:  1/2 piece of pizza, banana, water Snack: chips small bowl, water Third Meal:  630 pm Chicken thigh, honey mustard dressing, ramen nooodle and brussel sprouts, water Snack: chips Beverages: water  Estimated Energy Needs Calories: 1800 Carbohydrate: 200g Protein: 135g Fat: 50g   NUTRITION DIAGNOSIS  NI-1.7 Predicted excessive energy intake As related to Obesity.  As evidenced by 36 lbs weight gain and BMI 34.Marland Kitchen   NUTRITION INTERVENTION  Nutrition education (E-1) on the following topics:  Lifestyle Medicine  - Whole Food, Plant Predominant Nutrition is highly recommended: Eat Plenty of vegetables, Mushrooms, fruits, Legumes, Whole Grains, Nuts, seeds in lieu of processed meats, processed snacks/pastries red meat, poultry, eggs.    -It is better to avoid simple carbohydrates including: Cakes, Sweet Desserts, Ice Cream, Soda (diet and regular), Sweet Tea, Candies, Chips, Cookies, Store Bought Juices, Alcohol in Excess of  1-2 drinks a day, Lemonade,  Artificial Sweeteners, Doughnuts, Coffee Creamers, "Sugar-free" Products, etc, etc.  This is not a complete list.....  Exercise: If you are able: 30 -60 minutes a day ,4 days a week, or 150 minutes a week.  The  longer the better.  Combine stretch, strength, and aerobic activities.  If you were told in the past that you have high risk for cardiovascular diseases, you may seek evaluation by your heart doctor prior to initiating moderate to intense exercise programs.   Handouts Provided Include  Lifestyle Medicine handouts- whole food, plant based  Learning Style & Readiness for Change Teaching method utilized: Visual & Auditory  Demonstrated  degree of understanding via: Teach Back  Barriers to learning/adherence to lifestyle change: Physical limitations  Goals Established by Pt Goals  Cut out snacking between meals Cut out processed foods, dairy and beef and pork. Increase whole grains, fruits, and vegetables. Drink a gallon of water per day Eat meals on time: B) 8 am, L) 12-2 D)5-7 pm.  Avoid eating after  7pm. Find hobbies or activities to do between meals or at night instead of snacking.   MONITORING & EVALUATION Dietary intake, weekly physical activity, and weight in 1-2 months.  Next Steps  Patient is to work on eating more whole plant based foods and cut out processed junk food.Marland Kitchen

## 2022-11-07 ENCOUNTER — Ambulatory Visit: Payer: No Typology Code available for payment source | Admitting: Nutrition

## 2023-04-04 ENCOUNTER — Encounter: Payer: Self-pay | Admitting: *Deleted

## 2023-05-07 IMAGING — US US EXTREM LOW VENOUS*L*
1 series · 13 of 24 positions shown · non-contrast
Comparison: None.

CLINICAL DATA: 59-year-old male with a history of left lower
extremity pain



[Series 1: us venous img lower uni left (dvt) · portal-venous · 13 of 41 slices shown]
[im 1/41]
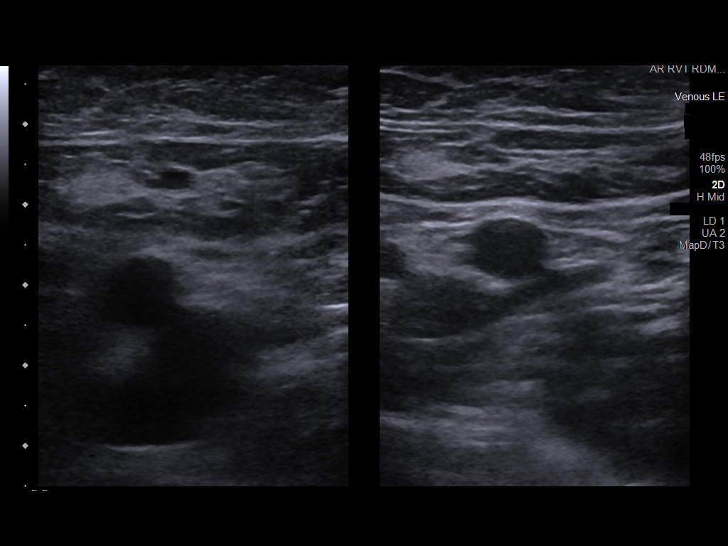
[im 4/41]
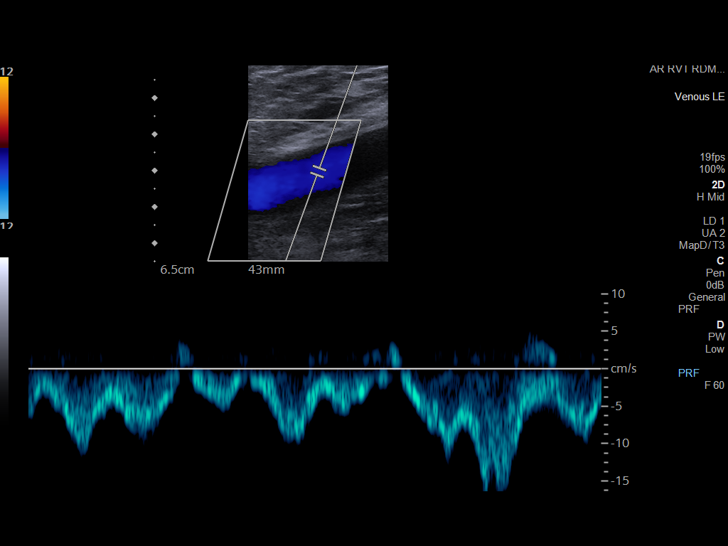
[im 7/41]
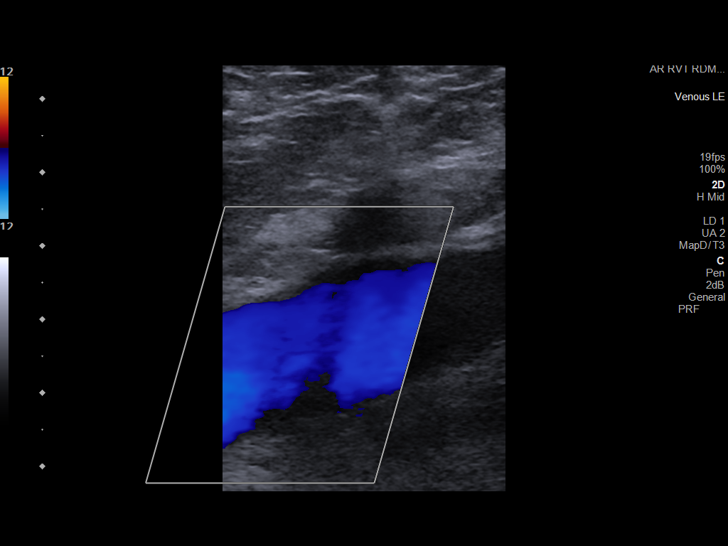
[im 11/41]
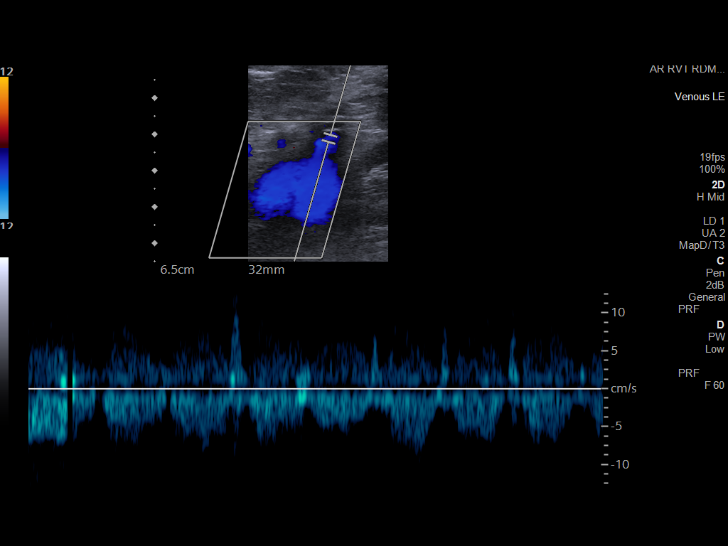
[im 14/41]
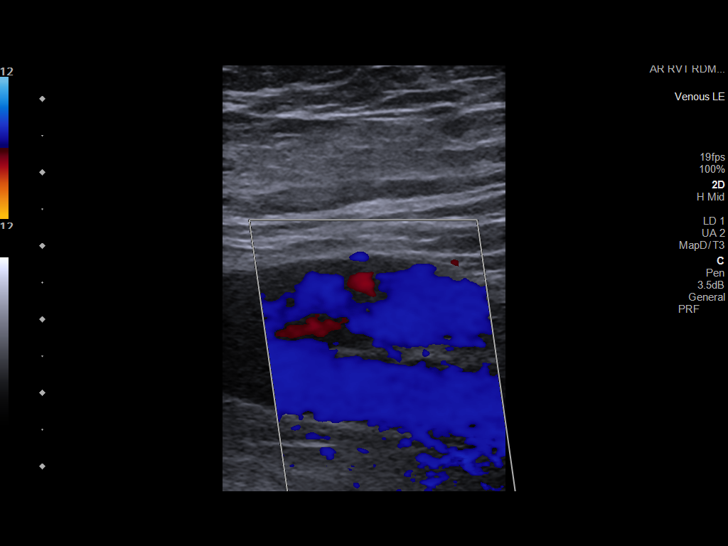
[im 18/41]
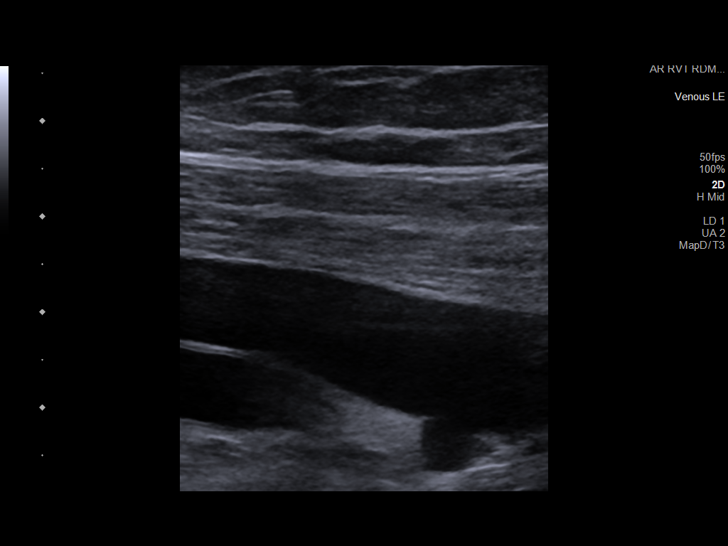
[im 21/41]
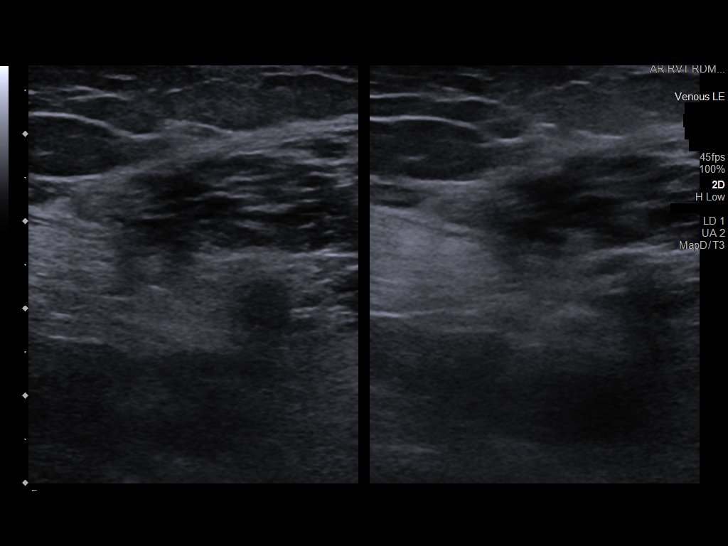
[im 23/41]
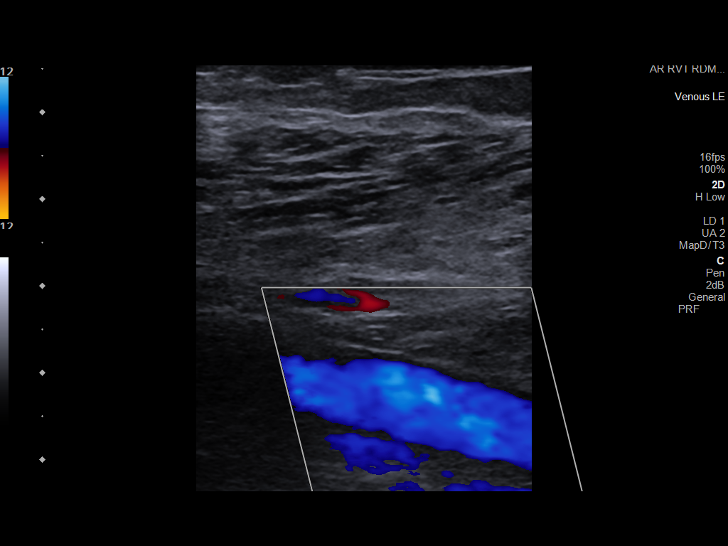
[im 27/41]
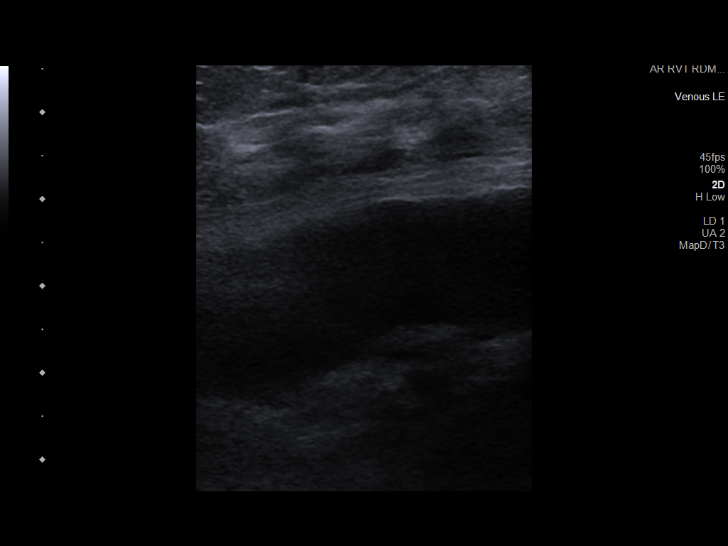
[im 30/41]
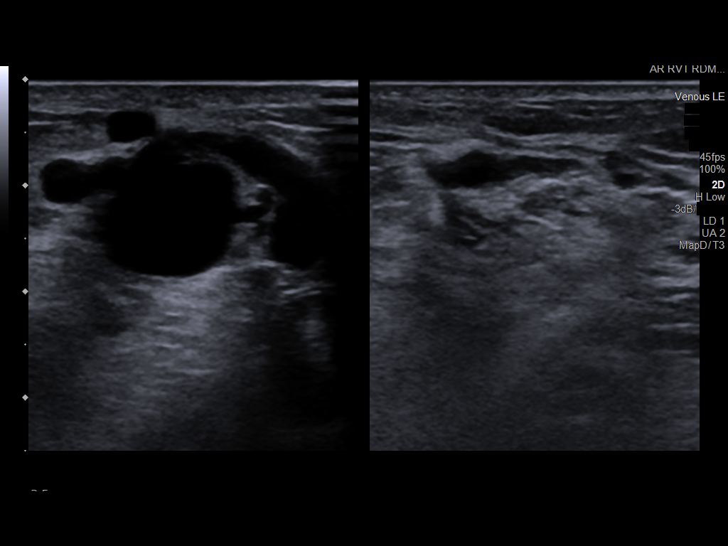
[im 34/41]
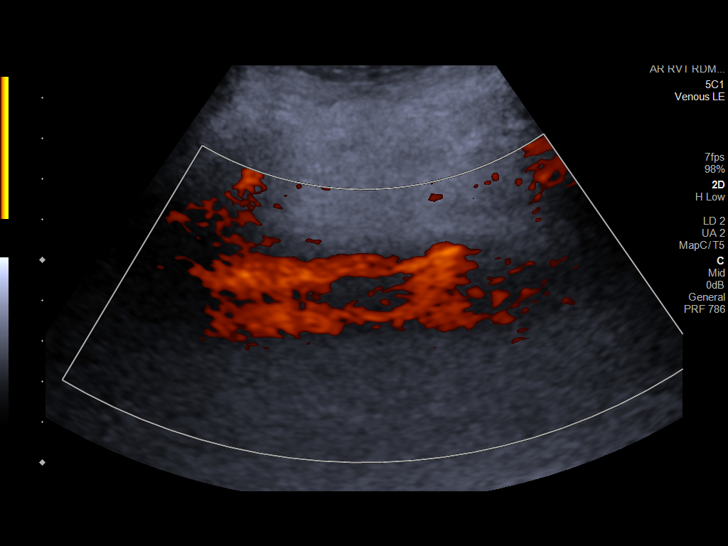
[im 37/41]
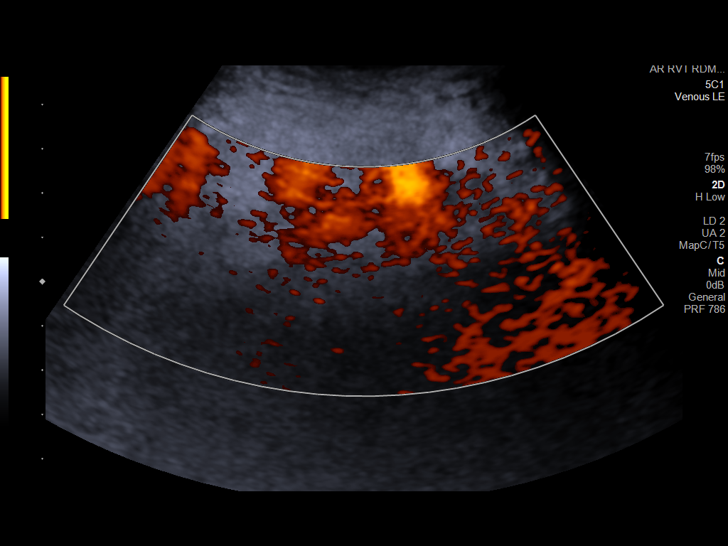
[im 41/41]
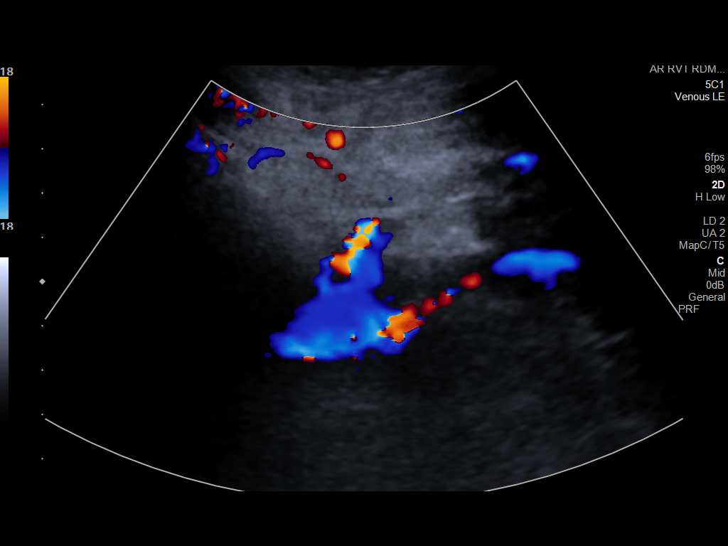

[13 of 24 positions shown; findings below may reference images not displayed]

FINDINGS: Contralateral Common Femoral Vein: Respiratory phasicity is normal
and symmetric with the symptomatic side. No evidence of thrombus.
Normal compressibility.

Common Femoral Vein: No evidence of thrombus. Normal
compressibility, respiratory phasicity and response to augmentation.

Saphenofemoral Junction: No evidence of thrombus. Normal
compressibility and flow on color Doppler imaging.

Profunda Femoral Vein: No evidence of thrombus. Normal
compressibility and flow on color Doppler imaging.

Femoral Vein: No evidence of thrombus. Normal compressibility,
respiratory phasicity and response to augmentation.

Popliteal Vein: No evidence of thrombus. Normal compressibility,
respiratory phasicity and response to augmentation.

Calf Veins: No evidence of thrombus. Normal compressibility and flow
on color Doppler imaging.

Superficial Great Saphenous Vein: No evidence of thrombus. Normal
compressibility and flow on color Doppler imaging.

Other Findings: Dilated varicose veins of the left leg. Superficial
edema.
IMPRESSION: Sonographic survey of the left lower extremity negative for DVT.

Dilated varicose veins of the left lower extremity, potentially
related to chronic venous insufficiency.

Left lower extremity edema, nonspecific

## 2023-05-08 IMAGING — DX DG FOOT 2V*L*
2 series · 2 of 2 positions shown · non-contrast
Comparison: 10/08/2020

CLINICAL DATA: Swollen infected foot. Suspicion of infection of
second toe.

EXAM:
LEFT FOOT - 2 VIEW

[foot ap]
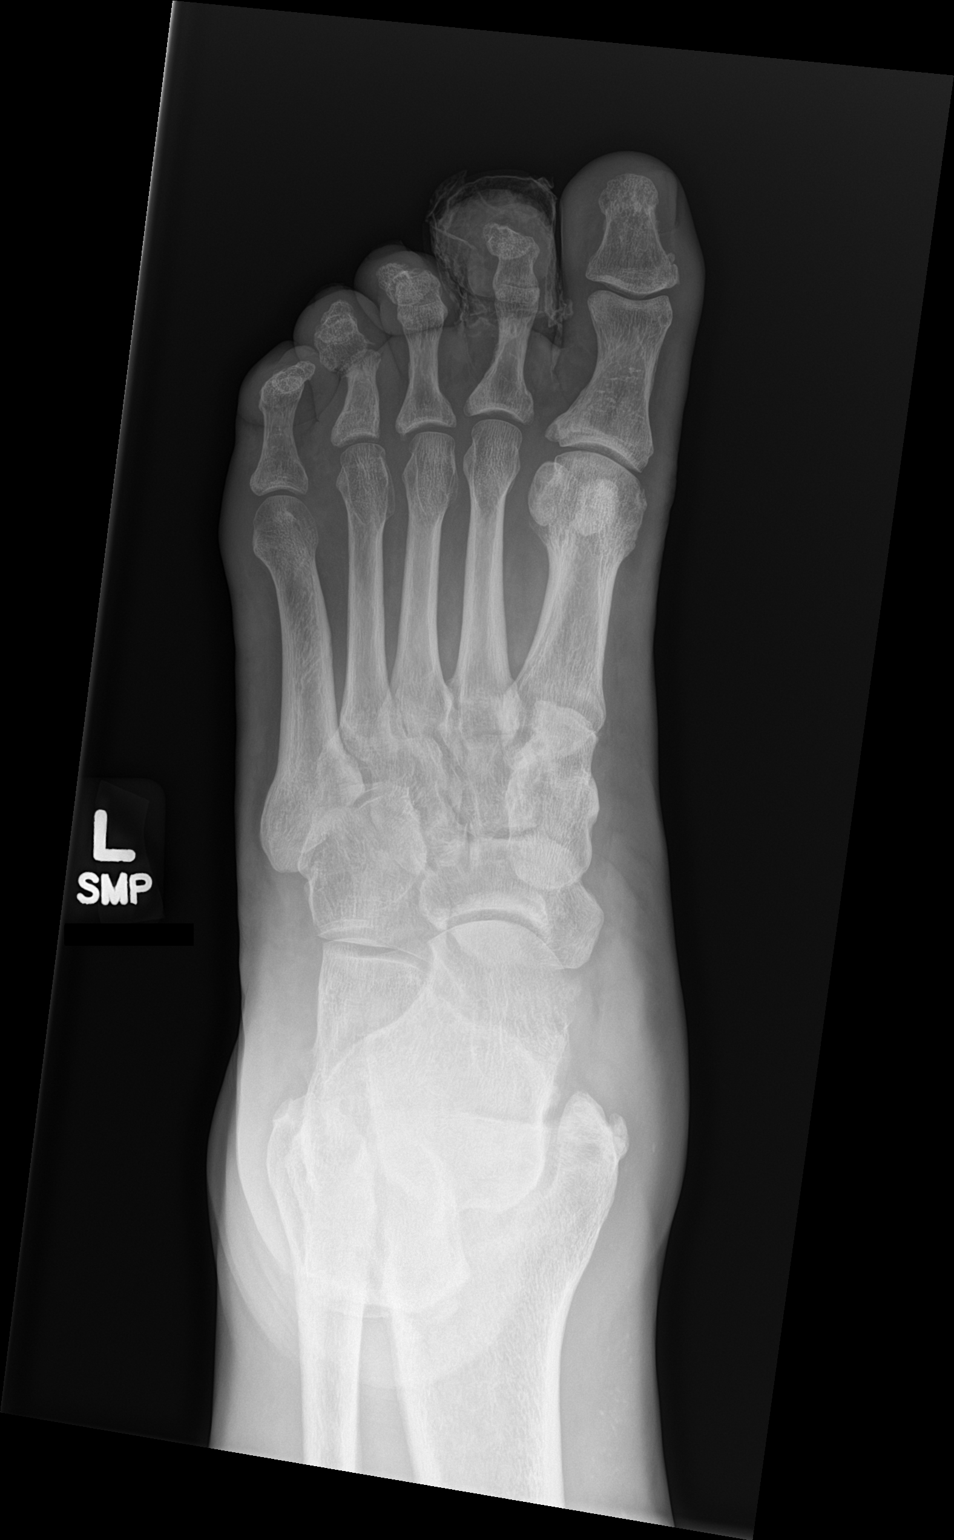

[foot lat]
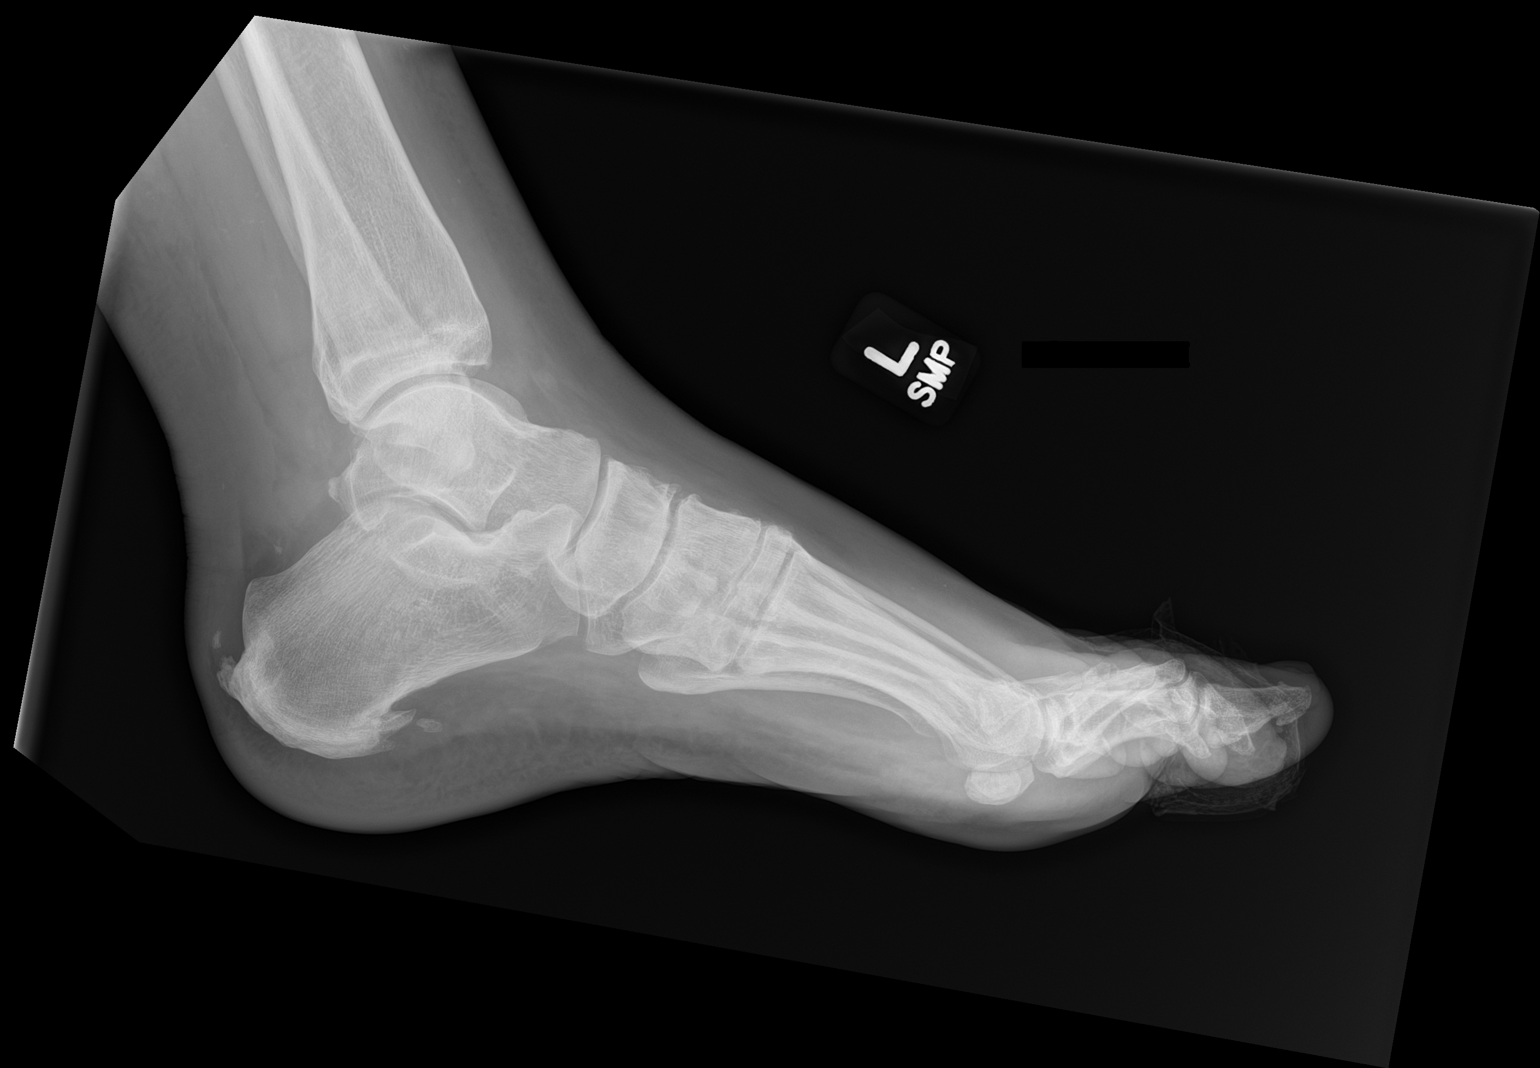

[2 of 2 positions shown; findings below may reference images not displayed]

FINDINGS: Multiple hammertoe deformities. Mild soft tissue swelling about the
midfoot. Small Achilles and calcaneal spurs. bandaging overlies the
second toe. No gross osseous destruction. Overlap of digits on the
lateral view. Oblique imaging not performed.
IMPRESSION: Multifactorial degradation, including bandaging overlying the second
digit, lack of oblique imaging. No gross osseous destruction to
confirm osteomyelitis. Mild diffuse soft tissue swelling. Consider
repeat three-view exam after removal of bandage material.

## 2023-05-14 ENCOUNTER — Telehealth: Payer: Self-pay | Admitting: Internal Medicine

## 2023-05-14 NOTE — Telephone Encounter (Signed)
Questionnaire in review. [facility] is aware.

## 2023-05-17 NOTE — Addendum Note (Signed)
Addended by: Armstead Peaks on: 05/17/2023 01:48 PM   Modules accepted: Orders

## 2023-05-17 NOTE — Telephone Encounter (Signed)
  Procedure: Colonoscopy  Height: 6'4 Weight: 295lbs        Have you had a colonoscopy before?  1990's  Do you have family history of colon cancer?  1 cousin  Do you have a family history of polyps? yes  Previous colonoscopy with polyps removed? no  Do you have a history colorectal cancer?   no  Are you diabetic?  no  Do you have a prosthetic or mechanical heart valve? no  Do you have a pacemaker/defibrillator?   no  Have you had endocarditis/atrial fibrillation?  no  Do you use supplemental oxygen/CPAP?  yes cpap  Have you had joint replacement within the last 12 months?  no  Do you tend to be constipated or have to use laxatives?  no   Do you have history of alcohol use? If yes, how much and how often.  Yes on weekend  Do you have history or are you using drugs? If yes, what do are you  using?  no  Have you ever had a stroke/heart attack?  no  Have you ever had a heart or other vascular stent placed,?no  Do you take weight loss medication? no  Do you take any blood-thinning medications such as: (Plavix, aspirin, Coumadin, Aggrenox, Brilinta, Xarelto, Eliquis, Pradaxa, Savaysa or Effient)? no  If yes we need the name, milligram, dosage and who is prescribing doctor:               Current Outpatient Medications  Medication Sig Dispense Refill   DULoxetine (CYMBALTA) 60 MG capsule Take 60 mg by mouth 2 (two) times daily.     famotidine (PEPCID) 20 MG tablet Take 20 mg by mouth daily.     gabapentin (NEURONTIN) 300 MG capsule Take 300 mg by mouth 3 (three) times daily.     lisinopril-hydrochlorothiazide (ZESTORETIC) 10-12.5 MG tablet Take 1 tablet by mouth daily.     penicillin v potassium (VEETID) 500 MG tablet TAKE ONE-HALF TABLET BY MOUTH TWICE A DAY PREVENTION OF CELLULITIS FOR PREVENTION OF CELLULITIS (DOSE IS 250MG )     prazosin (MINIPRESS) 2 MG capsule Take 2 mg by mouth at bedtime.     rosuvastatin (CRESTOR) 5 MG tablet Take 5 mg by mouth daily.     No  current facility-administered medications for this visit.    Allergies  Allergen Reactions   Latex Rash    Other reaction(s): rash/itching

## 2023-05-30 MED ORDER — PEG 3350-KCL-NA BICARB-NACL 420 G PO SOLR: 4000.0000 mL | Freq: Once | ORAL | 0 refills | Status: AC

## 2023-05-30 NOTE — Addendum Note (Signed)
Addended by: Armstead Peaks on: 05/30/2023 03:15 PM   Modules accepted: Orders

## 2023-05-30 NOTE — Telephone Encounter (Signed)
OK to schedule. Will need BMP prior.

## 2023-05-30 NOTE — Telephone Encounter (Addendum)
Spoke with pt. Scheduled with Dr. Marletta Lor 10/8. Will send rx for prep to [facility]. Instructions also sent. Darl Pikes, please let [facility] know.

## 2023-05-30 NOTE — Telephone Encounter (Signed)
ASA 2.  ?

## 2023-05-31 ENCOUNTER — Encounter: Payer: Self-pay | Admitting: *Deleted

## 2023-05-31 NOTE — Telephone Encounter (Signed)
Pt left vm saying needing to reschedule his procedure on 06/05/23  Pt has been rescheduled from 06/05/23 until 06/26/23 at 12:30 pm due to transportation issues.  Updated instructions mailed to pt.

## 2023-05-31 NOTE — Telephone Encounter (Signed)
Referral completed, [facility] apt letter sent to PCP If patient reschedules again he will need an extended [place] authorization, current auth expires 06/28/23

## 2023-06-06 ENCOUNTER — Telehealth: Payer: Self-pay | Admitting: Internal Medicine

## 2023-06-06 NOTE — Telephone Encounter (Signed)
noted 

## 2023-06-06 NOTE — Telephone Encounter (Signed)
I reached out to the [place] to let them know patient has procedure scheduled for 10/29 and we only had the preliminary [facility] auth that would expire on 06/27/2023/ They sent over the actual [place] auth and it will expire on 09/03/2023 and it's been entered and scanned.

## 2023-06-10 ENCOUNTER — Emergency Department (HOSPITAL_COMMUNITY): Payer: No Typology Code available for payment source

## 2023-06-10 ENCOUNTER — Other Ambulatory Visit: Payer: Self-pay

## 2023-06-10 ENCOUNTER — Encounter (HOSPITAL_COMMUNITY): Payer: Self-pay

## 2023-06-10 ENCOUNTER — Inpatient Hospital Stay (HOSPITAL_COMMUNITY)
Admission: EM | Admit: 2023-06-10 | Discharge: 2023-06-13 | DRG: 872 | Disposition: A | Discharge status: Home / Self Care | Payer: No Typology Code available for payment source | Attending: Internal Medicine | Admitting: Internal Medicine

## 2023-06-10 DIAGNOSIS — Z91040 Latex allergy status: Secondary | ICD-10-CM

## 2023-06-10 DIAGNOSIS — Z9049 Acquired absence of other specified parts of digestive tract: Secondary | ICD-10-CM | POA: Diagnosis not present

## 2023-06-10 DIAGNOSIS — L03116 Cellulitis of left lower limb: Secondary | ICD-10-CM | POA: Diagnosis present

## 2023-06-10 DIAGNOSIS — R6510 Systemic inflammatory response syndrome (SIRS) of non-infectious origin without acute organ dysfunction: Secondary | ICD-10-CM

## 2023-06-10 DIAGNOSIS — A419 Sepsis, unspecified organism: Secondary | ICD-10-CM | POA: Diagnosis present

## 2023-06-10 DIAGNOSIS — Z1152 Encounter for screening for COVID-19: Secondary | ICD-10-CM

## 2023-06-10 DIAGNOSIS — E785 Hyperlipidemia, unspecified: Secondary | ICD-10-CM | POA: Diagnosis present

## 2023-06-10 DIAGNOSIS — G894 Chronic pain syndrome: Secondary | ICD-10-CM | POA: Diagnosis not present

## 2023-06-10 DIAGNOSIS — Z96653 Presence of artificial knee joint, bilateral: Secondary | ICD-10-CM | POA: Diagnosis present

## 2023-06-10 DIAGNOSIS — I872 Venous insufficiency (chronic) (peripheral): Secondary | ICD-10-CM | POA: Diagnosis present

## 2023-06-10 DIAGNOSIS — G8929 Other chronic pain: Secondary | ICD-10-CM | POA: Diagnosis present

## 2023-06-10 DIAGNOSIS — Z87891 Personal history of nicotine dependence: Secondary | ICD-10-CM | POA: Diagnosis not present

## 2023-06-10 DIAGNOSIS — F32A Depression, unspecified: Secondary | ICD-10-CM | POA: Diagnosis present

## 2023-06-10 DIAGNOSIS — Z79899 Other long term (current) drug therapy: Secondary | ICD-10-CM | POA: Diagnosis not present

## 2023-06-10 DIAGNOSIS — K219 Gastro-esophageal reflux disease without esophagitis: Secondary | ICD-10-CM | POA: Diagnosis present

## 2023-06-10 DIAGNOSIS — I10 Essential (primary) hypertension: Secondary | ICD-10-CM | POA: Diagnosis present

## 2023-06-10 DIAGNOSIS — D72829 Elevated white blood cell count, unspecified: Secondary | ICD-10-CM

## 2023-06-10 DIAGNOSIS — L0390 Cellulitis, unspecified: Secondary | ICD-10-CM | POA: Insufficient documentation

## 2023-06-10 LAB — CBC WITH DIFFERENTIAL/PLATELET
Abs Immature Granulocytes: 0.16 10*3/uL — ABNORMAL HIGH (ref 0.00–0.07)
Basophils Absolute: 0.1 10*3/uL (ref 0.0–0.1)
Basophils Relative: 0 %
Eosinophils Absolute: 0 10*3/uL (ref 0.0–0.5)
Eosinophils Relative: 0 %
HCT: 44.4 % (ref 39.0–52.0)
Hemoglobin: 15 g/dL (ref 13.0–17.0)
Immature Granulocytes: 1 %
Lymphocytes Relative: 4 %
Lymphs Abs: 0.7 10*3/uL (ref 0.7–4.0)
MCH: 33.6 pg (ref 26.0–34.0)
MCHC: 33.8 g/dL (ref 30.0–36.0)
MCV: 99.3 fL (ref 80.0–100.0)
Monocytes Absolute: 0.7 10*3/uL (ref 0.1–1.0)
Monocytes Relative: 3 %
Neutro Abs: 17.6 10*3/uL — ABNORMAL HIGH (ref 1.7–7.7)
Neutrophils Relative %: 92 %
Platelets: 198 10*3/uL (ref 150–400)
RBC: 4.47 MIL/uL (ref 4.22–5.81)
RDW: 13.2 % (ref 11.5–15.5)
WBC: 19.1 10*3/uL — ABNORMAL HIGH (ref 4.0–10.5)
nRBC: 0.1 % (ref 0.0–0.2)

## 2023-06-10 LAB — COMPREHENSIVE METABOLIC PANEL
ALT: 27 U/L (ref 0–44)
AST: 29 U/L (ref 15–41)
Albumin: 3.9 g/dL (ref 3.5–5.0)
Alkaline Phosphatase: 67 U/L (ref 38–126)
Anion gap: 9 (ref 5–15)
BUN: 17 mg/dL (ref 8–23)
CO2: 26 mmol/L (ref 22–32)
Calcium: 9.2 mg/dL (ref 8.9–10.3)
Chloride: 100 mmol/L (ref 98–111)
Creatinine, Ser: 0.84 mg/dL (ref 0.61–1.24)
GFR, Estimated: >60 mL/min (ref >60)
Glucose, Bld: 141 mg/dL — ABNORMAL HIGH (ref 70–99)
Potassium: 3.7 mmol/L (ref 3.5–5.1)
Sodium: 135 mmol/L (ref 135–145)
Total Bilirubin: 1.1 mg/dL (ref 0.3–1.2)
Total Protein: 7.8 g/dL (ref 6.5–8.1)

## 2023-06-10 LAB — LACTIC ACID, PLASMA: Lactic Acid, Venous: 1.5 mmol/L (ref 0.5–1.9)

## 2023-06-10 MED ORDER — ONDANSETRON HCL 4 MG PO TABS: 4.0000 mg | ORAL_TABLET | Freq: Four times a day (QID) | ORAL | Status: DC | PRN

## 2023-06-10 MED ORDER — GABAPENTIN 300 MG PO CAPS
300.0000 mg | ORAL_CAPSULE | Freq: Three times a day (TID) | ORAL | Status: DC
  Administered 2023-06-11 – 2023-06-13 (×7): 300 mg via ORAL
  Filled 2023-06-10 (×2): qty 1
  Filled 2023-06-10: qty 3
  Filled 2023-06-10 (×4): qty 1

## 2023-06-10 MED ORDER — DULOXETINE HCL 60 MG PO CPEP
60.0000 mg | ORAL_CAPSULE | Freq: Two times a day (BID) | ORAL | Status: DC
  Administered 2023-06-11 – 2023-06-13 (×5): 60 mg via ORAL
  Filled 2023-06-10 (×5): qty 1

## 2023-06-10 MED ORDER — VANCOMYCIN HCL 2000 MG/400ML IV SOLN
2000.0000 mg | Freq: Once | INTRAVENOUS | Status: AC
  Administered 2023-06-10: 2000 mg via INTRAVENOUS
  Filled 2023-06-10: qty 400

## 2023-06-10 MED ORDER — ACETAMINOPHEN 650 MG RE SUPP: 650.0000 mg | Freq: Four times a day (QID) | RECTAL | Status: DC | PRN

## 2023-06-10 MED ORDER — SODIUM CHLORIDE 0.9 % IV SOLN
1.0000 g | INTRAVENOUS | Status: DC
  Administered 2023-06-10 – 2023-06-12 (×3): 1 g via INTRAVENOUS
  Filled 2023-06-10 (×3): qty 10

## 2023-06-10 MED ORDER — HEPARIN SODIUM (PORCINE) 5000 UNIT/ML IJ SOLN
5000.0000 [IU] | Freq: Three times a day (TID) | INTRAMUSCULAR | Status: DC
  Administered 2023-06-10 – 2023-06-13 (×9): 5000 [IU] via SUBCUTANEOUS
  Filled 2023-06-10 (×10): qty 1

## 2023-06-10 MED ORDER — ACETAMINOPHEN 500 MG PO TABS
1000.0000 mg | ORAL_TABLET | Freq: Once | ORAL | Status: AC
  Administered 2023-06-10: 1000 mg via ORAL
  Filled 2023-06-10: qty 2

## 2023-06-10 MED ORDER — OXYCODONE HCL 5 MG PO TABS
5.0000 mg | ORAL_TABLET | ORAL | Status: DC | PRN
  Administered 2023-06-10 – 2023-06-13 (×7): 5 mg via ORAL
  Filled 2023-06-10 (×7): qty 1

## 2023-06-10 MED ORDER — ROSUVASTATIN CALCIUM 10 MG PO TABS
5.0000 mg | ORAL_TABLET | Freq: Every day | ORAL | Status: DC
  Administered 2023-06-11 – 2023-06-13 (×3): 5 mg via ORAL
  Filled 2023-06-10 (×3): qty 1

## 2023-06-10 MED ORDER — LISINOPRIL-HYDROCHLOROTHIAZIDE 10-12.5 MG PO TABS: 1.0000 | ORAL_TABLET | Freq: Every day | ORAL | Status: DC

## 2023-06-10 MED ORDER — FAMOTIDINE 20 MG PO TABS
20.0000 mg | ORAL_TABLET | Freq: Every day | ORAL | Status: DC
  Administered 2023-06-11 – 2023-06-13 (×3): 20 mg via ORAL
  Filled 2023-06-10 (×3): qty 1

## 2023-06-10 MED ORDER — PRAZOSIN HCL 1 MG PO CAPS
2.0000 mg | ORAL_CAPSULE | Freq: Every day | ORAL | Status: DC
  Filled 2023-06-10 (×2): qty 2

## 2023-06-10 MED ORDER — ACETAMINOPHEN 325 MG PO TABS: 650.0000 mg | ORAL_TABLET | Freq: Four times a day (QID) | ORAL | Status: DC | PRN

## 2023-06-10 MED ORDER — VANCOMYCIN HCL IN DEXTROSE 1-5 GM/200ML-% IV SOLN: 1000.0000 mg | Freq: Once | INTRAVENOUS | Status: DC

## 2023-06-10 MED ORDER — SODIUM CHLORIDE 0.9 % IV SOLN: INTRAVENOUS | Status: AC

## 2023-06-10 MED ORDER — SODIUM CHLORIDE 0.9 % IV SOLN
2.0000 g | Freq: Once | INTRAVENOUS | Status: AC
  Administered 2023-06-10: 2 g via INTRAVENOUS
  Filled 2023-06-10: qty 12.5

## 2023-06-10 MED ORDER — ONDANSETRON HCL 4 MG/2ML IJ SOLN: 4.0000 mg | Freq: Four times a day (QID) | INTRAMUSCULAR | Status: DC | PRN

## 2023-06-10 MED ORDER — MORPHINE SULFATE (PF) 2 MG/ML IV SOLN
2.0000 mg | INTRAVENOUS | Status: DC | PRN
  Administered 2023-06-11 – 2023-06-12 (×8): 2 mg via INTRAVENOUS
  Filled 2023-06-10 (×8): qty 1

## 2023-06-10 NOTE — H&P (Incomplete)
History and Physical    Patient: Brandon Strickland ZOX:[id number] DOB: 1961-03-22 DOA: 06/10/2023 DOS: the patient was seen and examined on 06/10/2023 PCP: Hulan Saas, FNP  Patient coming from: {Point_of_Origin:[address]}  Chief Complaint:  Chief Complaint  Patient presents with   Wound Infection   HPI: Commodore Bellew is a 62 y.o. male with medical history significant of ***  Review of Systems: {ROS_Text:26778} Past Medical History:  Diagnosis Date   Family history of breast cancer 01/11/2022   Family history of colon cancer 01/11/2022   Family history of Li-Fraumeni syndrome 01/11/2022   Family history of ovarian cancer 01/11/2022   Hypertension    Past Surgical History:  Procedure Laterality Date   CHOLECYSTECTOMY     HERNIA REPAIR     REPLACEMENT TOTAL KNEE BILATERAL  05/2019   ROTATOR CUFF REPAIR Right    VARICOSE VEIN SURGERY Left    4 surgery   Social History:  reports that he quit smoking about 11 years ago. His smoking use included cigarettes. He has never been exposed to tobacco smoke. He has never used smokeless tobacco. He reports current alcohol use of about 3.0 standard drinks of alcohol per week. He reports that he does not use drugs.  Allergies  Allergen Reactions   Latex Rash    Other reaction(s): rash/itching    Family History  Problem Relation Age of Onset   Diabetes Mellitus II Mother    Breast cancer Mother 23   Breast cancer Maternal Aunt        dx <50, x3 maternal aunts   Ovarian cancer Maternal Aunt        dx <50   Colon cancer Maternal Uncle        dx <50   Leukemia Maternal Uncle        dx <50   Leukemia Paternal Uncle        dx after 28   Breast cancer Cousin        dx <50   Lung cancer Cousin        dx <50   Brain cancer Cousin        dx <50   Cancer - Other Cousin        sarcoma/thigh; ovarian pre cancer? dx <50   Li-Fraumeni syndrome Cousin    Throat cancer Cousin    Colon cancer Cousin    Brain cancer Cousin        d. 6 months     Prior to Admission medications   Medication Sig Start Date End Date Taking? Authorizing Provider  DULoxetine (CYMBALTA) 60 MG capsule Take 60 mg by mouth 2 (two) times daily. 10/05/21   [provider]  famotidine (PEPCID) 20 MG tablet Take 20 mg by mouth daily. 01/12/20   [provider]  gabapentin (NEURONTIN) 300 MG capsule Take 300 mg by mouth 3 (three) times daily.    [provider]  lisinopril-hydrochlorothiazide (ZESTORETIC) 10-12.5 MG tablet Take 1 tablet by mouth daily. 01/12/20   [provider]  penicillin v potassium (VEETID) 500 MG tablet TAKE ONE-HALF TABLET BY MOUTH TWICE A DAY PREVENTION OF CELLULITIS FOR PREVENTION OF CELLULITIS (DOSE IS 250MG ) 12/30/21   [provider]  prazosin (MINIPRESS) 2 MG capsule Take 2 mg by mouth at bedtime.    [provider]  rosuvastatin (CRESTOR) 5 MG tablet Take 5 mg by mouth daily.    [provider]    Physical Exam: Vitals:   06/10/23 1900 06/10/23 1930 06/10/23 1934  06/10/23 2034  BP: 117/67 127/69  134/64  Pulse: 99 97  99  Resp: (!) 22  18 18   Temp:    98.2 F (36.8 C)  TempSrc:    Oral  SpO2: 96% 94%  100%  Weight:    128.6 kg  Height:    6\' 4"  (1.93 m)   *** Data Reviewed: {Tip this will not be part of the note when signed- Document your independent interpretation of telemetry tracing, EKG, lab, Radiology test or any other diagnostic tests. Add any new diagnostic test ordered today. (Optional):26781} {Results:26384}  Assessment and Plan: No notes have been filed under this hospital service. Service: Hospitalist     Advance Care Planning:   Code Status: Full Code ***  Consults: ***  Family Communication: ***  Severity of Illness: {Observation/Inpatient:21159}  Author: Lilyan Gilford, DO 06/10/2023 10:02 PM  For on call review [email or web address].

## 2023-06-10 NOTE — ED Notes (Signed)
12 lead completed in triage MD informed of code sepsis when EKG was delivered 2 IV's started Labs and cultures drawn and sent Waiting on ED room, pt in VT2

## 2023-06-10 NOTE — ED Provider Notes (Addendum)
[place] EMERGENCY DEPARTMENT AT [facility] Provider Note   CSN: [id number] Arrival date & time: 06/10/23  1502     History  Chief Complaint  Patient presents with   Wound Infection    Brandon Strickland is a 62 y.o. male.  Pt with hx chronic venous stasis, hx lower extremity cellulitis, c/o fever, general weakness, sweats, and increased leg lower leg redness/pain in past 1-2  days. Symptoms acute onset, moderate, persistent. Similar symptoms previously when had cellulitis. No trauma to leg. No chest pain or sob. No abd pain or nv.   The history is provided by the patient, the spouse and medical records.       Home Medications Prior to Admission medications   Medication Sig Start Date End Date Taking? Authorizing Provider  DULoxetine (CYMBALTA) 60 MG capsule Take 60 mg by mouth 2 (two) times daily. 10/05/21   [provider]  famotidine (PEPCID) 20 MG tablet Take 20 mg by mouth daily. 01/12/20   [provider]  gabapentin (NEURONTIN) 300 MG capsule Take 300 mg by mouth 3 (three) times daily.    [provider]  lisinopril-hydrochlorothiazide (ZESTORETIC) 10-12.5 MG tablet Take 1 tablet by mouth daily. 01/12/20   [provider]  penicillin v potassium (VEETID) 500 MG tablet TAKE ONE-HALF TABLET BY MOUTH TWICE A DAY PREVENTION OF CELLULITIS FOR PREVENTION OF CELLULITIS (DOSE IS 250MG ) 12/30/21   [provider]  prazosin (MINIPRESS) 2 MG capsule Take 2 mg by mouth at bedtime.    [provider]  rosuvastatin (CRESTOR) 5 MG tablet Take 5 mg by mouth daily.    [provider]      Allergies    Latex    Review of Systems   Review of Systems  Constitutional:  Positive for diaphoresis and fever.  HENT:  Negative for sore throat.   Eyes:  Negative for redness.  Respiratory:  Negative for cough and shortness of breath.   Cardiovascular:  Negative for chest pain.  Gastrointestinal:  Negative for abdominal pain,  diarrhea and vomiting.  Genitourinary:  Negative for dysuria and flank pain.  Musculoskeletal:  Negative for back pain and neck pain.  Skin:  Negative for wound.  Neurological:  Negative for headaches.  Hematological:  Does not bruise/bleed easily.  Psychiatric/Behavioral:  Negative for confusion.     Physical Exam Updated Vital Signs BP 117/67   Pulse 99   Temp 98.1 F (36.7 C) (Oral)   Resp (!) 22   Ht 1.93 m (6\' 4" )   Wt 134.7 kg   SpO2 96%   BMI 36.15 kg/m  Physical Exam Vitals and nursing note reviewed.  Constitutional:      Appearance: Normal appearance. He is well-developed.  HENT:     Head: Atraumatic.     Nose: Nose normal.     Mouth/Throat:     Mouth: Mucous membranes are moist.     Pharynx: Oropharynx is clear.  Eyes:     General: No scleral icterus.    Conjunctiva/sclera: Conjunctivae normal.  Neck:     Trachea: No tracheal deviation.  Cardiovascular:     Rate and Rhythm: Regular rhythm. Tachycardia present.     Pulses: Normal pulses.     Heart sounds: Normal heart sounds. No murmur heard.    No friction rub. No gallop.  Pulmonary:     Effort: Pulmonary effort is normal. No accessory muscle usage or respiratory distress.     Breath sounds: Normal breath sounds.  Abdominal:     General: Bowel sounds are normal. There is no distension.     Palpations: Abdomen is soft.     Tenderness: There is no abdominal tenderness. There is no guarding.  Genitourinary:    Comments: No cva tenderness. Musculoskeletal:     Cervical back: Normal range of motion and neck supple. No rigidity.     Comments: Bilateral legs w skin changes c/w chronic venous stasis dermatitis. In addition, left lower leg with increased warmth and erythema c/w cellulitis. Distal pulses palp. No crepitus to area. Compartments of LLE soft, not tense, no severe swelling.   Skin:    General: Skin is warm and dry.     Findings: No rash.  Neurological:     Mental Status: He is alert.      Comments: Alert, speech clear. LLE nvi, with intact motor/sens fxn.   Psychiatric:        Mood and Affect: Mood normal.     ED Results / Procedures / Treatments   Labs (all labs ordered are listed, but only abnormal results are displayed) Results for orders placed or performed during the hospital encounter of 06/10/23  Blood culture (routine x 2)   Specimen: Right Antecubital; Blood  Result Value Ref Range   Specimen Description      RIGHT ANTECUBITAL BOTTLES DRAWN AEROBIC AND ANAEROBIC   Special Requests      Blood Culture adequate volume Performed at [facility], [address], [place], [place] [address]    Culture PENDING    Report Status PENDING   Blood culture (routine x 2)   Specimen: BLOOD RIGHT HAND  Result Value Ref Range   Specimen Description      BLOOD RIGHT HAND BOTTLES DRAWN AEROBIC AND ANAEROBIC   Special Requests      Blood Culture adequate volume Performed at [facility], [address] [place], [place] [address]    Culture PENDING    Report Status PENDING   CBC with Differential  Result Value Ref Range   WBC 19.1 (H) 4.0 - 10.5 K/uL   RBC 4.47 4.22 - 5.81 MIL/uL   Hemoglobin 15.0 13.0 - 17.0 g/dL   HCT 82.9 56.2 - 13.0 %   MCV 99.3 80.0 - 100.0 fL   MCH 33.6 26.0 - 34.0 pg   MCHC 33.8 30.0 - 36.0 g/dL   RDW 86.5 78.4 - 69.6 %   Platelets 198 150 - 400 K/uL   nRBC 0.1 0.0 - 0.2 %   Neutrophils Relative % 92 %   Neutro Abs 17.6 (H) 1.7 - 7.7 K/uL   Lymphocytes Relative 4 %   Lymphs Abs 0.7 0.7 - 4.0 K/uL   Monocytes Relative 3 %   Monocytes Absolute 0.7 0.1 - 1.0 K/uL   Eosinophils Relative 0 %   Eosinophils Absolute 0.0 0.0 - 0.5 K/uL   Basophils Relative 0 %   Basophils Absolute 0.1 0.0 - 0.1 K/uL   Immature Granulocytes 1 %   Abs Immature Granulocytes 0.16 (H) 0.00 - 0.07 K/uL  Comprehensive metabolic panel  Result Value Ref Range   Sodium 135 135 - 145 mmol/L   Potassium 3.7 3.5 - 5.1 mmol/L   Chloride 100 98 - 111 mmol/L   CO2 26 22  - 32 mmol/L   Glucose, Bld 141 (H) 70 - 99 mg/dL   BUN 17 8 - 23 mg/dL   Creatinine, Ser 2.95 0.61 - 1.24 mg/dL   Calcium 9.2 8.9 - 28.4 mg/dL  Total Protein 7.8 6.5 - 8.1 g/dL   Albumin 3.9 3.5 - 5.0 g/dL   AST 29 15 - 41 U/L   ALT 27 0 - 44 U/L   Alkaline Phosphatase 67 38 - 126 U/L   Total Bilirubin 1.1 0.3 - 1.2 mg/dL   GFR, Estimated >96 >04 mL/min   Anion gap 9 5 - 15  Lactic acid, plasma  Result Value Ref Range   Lactic Acid, Venous 1.5 0.5 - 1.9 mmol/L      EKG EKG Interpretation Date/Time:  Sunday June 10 2023 15:19:27 EDT Ventricular Rate:  125 PR Interval:  140 QRS Duration:  160 QT Interval:  322 QTC Calculation: 464 R Axis:   -76  Text Interpretation: Sinus tachycardia Right bundle branch block Left anterior fascicular block Premature ventricular complexes Confirmed by Cathren Laine ([id number]) on 06/10/2023 3:21:47 PM  Radiology DG Chest Port 1 View  Result Date: 06/10/2023 CLINICAL DATA:  Sepsis EXAM: PORTABLE CHEST 1 VIEW COMPARISON:  Chest x-ray 03/25/2021 FINDINGS: There is a small left pleural effusion and minimal patchy opacities in the left lung base. The right lung is clear. The heart is mildly enlarged, unchanged. There is no pneumothorax or acute fracture. IMPRESSION: Small left pleural effusion and minimal patchy opacities in the left lung base which may reflect atelectasis or infection. Electronically Signed   By: Darliss Cheney M.D.   On: 06/10/2023 19:15    Procedures Procedures    Medications Ordered in ED Medications  ceFEPIme (MAXIPIME) 2 g in sodium chloride 0.9 % 100 mL IVPB (0 g Intravenous Stopped 06/10/23 1624)  vancomycin (VANCOREADY) IVPB 2000 mg/400 mL (0 mg Intravenous Stopped 06/10/23 1750)  acetaminophen (TYLENOL) tablet 1,000 mg (1,000 mg Oral Given 06/10/23 1617)    ED Course/ Medical Decision Making/ A&P                                 Medical Decision Making Problems Addressed: Cellulitis of left lower leg: acute  illness or injury with systemic symptoms that poses a threat to life or bodily functions Leukocytosis, unspecified type: acute illness or injury  Amount and/or Complexity of Data Reviewed Independent Historian: spouse    Details: hx External Data Reviewed: notes. Labs: ordered. Decision-making details documented in ED Course. Radiology: ordered and independent interpretation performed. Decision-making details documented in ED Course. ECG/medicine tests: ordered and independent interpretation performed. Decision-making details documented in ED Course.  Risk OTC drugs. Prescription drug management. Decision regarding hospitalization.   Iv ns. Continuous pulse ox and cardiac monitoring. Labs ordered/sent.   Differential diagnosis includes sepsis, cellulitis, etc. Dispo decision including potential need for admission considered - will get labs and imaging and reassess.   Reviewed nursing notes and prior charts for additional history. External reports reviewed. Additional history from: family.   Cardiac monitor: sinus rhythm, rate 120.  Code sepsis activation. Cultures sent. Iv antibiotics given.   Labs reviewed/interpreted by me - wbc high. Hct normal.  Lactate is normal. No aki.   Prior imaging reviewed/interpreted by me - no dvt then.   Xrays reviewed/interpreted by me - ?atelectasis. Denies new or prod cough.   Medicine consulted for admission. Discussed pt -will admit.            Final Clinical Impression(s) / ED Diagnoses Final diagnoses:  Cellulitis of left lower leg  Leukocytosis, unspecified type    Rx / DC Orders ED Discharge Orders  None          Cathren Laine, MD 06/10/23 [phone]

## 2023-06-10 NOTE — ED Triage Notes (Signed)
Woke this morning with chills, fever, shaking and B/L leg pain. LEFT leg tender to touch and warm Hx of cellulitis and sepsis.   Took APAP 3 hours ago Temp was 100.6 at home

## 2023-06-10 NOTE — Sepsis Progress Note (Signed)
Sepsis protocol is being followed by [facility].

## 2023-06-10 NOTE — ED Notes (Signed)
Informed Consulting civil engineer of code sepsis

## 2023-06-11 DIAGNOSIS — I10 Essential (primary) hypertension: Secondary | ICD-10-CM

## 2023-06-11 DIAGNOSIS — K219 Gastro-esophageal reflux disease without esophagitis: Secondary | ICD-10-CM

## 2023-06-11 DIAGNOSIS — G894 Chronic pain syndrome: Secondary | ICD-10-CM

## 2023-06-11 DIAGNOSIS — G8929 Other chronic pain: Secondary | ICD-10-CM | POA: Insufficient documentation

## 2023-06-11 DIAGNOSIS — I872 Venous insufficiency (chronic) (peripheral): Secondary | ICD-10-CM

## 2023-06-11 DIAGNOSIS — L0390 Cellulitis, unspecified: Secondary | ICD-10-CM | POA: Insufficient documentation

## 2023-06-11 DIAGNOSIS — L03116 Cellulitis of left lower limb: Secondary | ICD-10-CM

## 2023-06-11 DIAGNOSIS — A419 Sepsis, unspecified organism: Principal | ICD-10-CM

## 2023-06-11 LAB — URINALYSIS, COMPLETE (UACMP) WITH MICROSCOPIC
Bacteria, UA: NONE SEEN
Bilirubin Urine: NEGATIVE
Glucose, UA: NEGATIVE mg/dL
Hgb urine dipstick: NEGATIVE
Ketones, ur: NEGATIVE mg/dL
Leukocytes,Ua: NEGATIVE
Nitrite: NEGATIVE
Protein, ur: NEGATIVE mg/dL
Specific Gravity, Urine: 1.027 (ref 1.005–1.030)
pH: 6 (ref 5.0–8.0)

## 2023-06-11 LAB — CBC
HCT: 40.3 % (ref 39.0–52.0)
Hemoglobin: 13.2 g/dL (ref 13.0–17.0)
MCH: 33.1 pg (ref 26.0–34.0)
MCHC: 32.8 g/dL (ref 30.0–36.0)
MCV: 101 fL — ABNORMAL HIGH (ref 80.0–100.0)
Platelets: 186 10*3/uL (ref 150–400)
RBC: 3.99 MIL/uL — ABNORMAL LOW (ref 4.22–5.81)
RDW: 13.2 % (ref 11.5–15.5)
WBC: 19 10*3/uL — ABNORMAL HIGH (ref 4.0–10.5)
nRBC: 0 % (ref 0.0–0.2)

## 2023-06-11 LAB — MAGNESIUM: Magnesium: 1.9 mg/dL (ref 1.7–2.4)

## 2023-06-11 LAB — COMPREHENSIVE METABOLIC PANEL
ALT: 22 U/L (ref 0–44)
AST: 22 U/L (ref 15–41)
Albumin: 3.3 g/dL — ABNORMAL LOW (ref 3.5–5.0)
Alkaline Phosphatase: 51 U/L (ref 38–126)
Anion gap: 9 (ref 5–15)
BUN: 16 mg/dL (ref 8–23)
CO2: 25 mmol/L (ref 22–32)
Calcium: 8.9 mg/dL (ref 8.9–10.3)
Chloride: 102 mmol/L (ref 98–111)
Creatinine, Ser: 0.76 mg/dL (ref 0.61–1.24)
GFR, Estimated: >60 mL/min (ref >60)
Glucose, Bld: 116 mg/dL — ABNORMAL HIGH (ref 70–99)
Potassium: 3.3 mmol/L — ABNORMAL LOW (ref 3.5–5.1)
Sodium: 136 mmol/L (ref 135–145)
Total Bilirubin: 0.9 mg/dL (ref 0.3–1.2)
Total Protein: 6.5 g/dL (ref 6.5–8.1)

## 2023-06-11 LAB — SARS CORONAVIRUS 2 BY RT PCR: SARS Coronavirus 2 by RT PCR: NEGATIVE

## 2023-06-11 LAB — HIV ANTIBODY (ROUTINE TESTING W REFLEX): HIV Screen 4th Generation wRfx: NONREACTIVE

## 2023-06-11 MED ORDER — LISINOPRIL 10 MG PO TABS
10.0000 mg | ORAL_TABLET | Freq: Every day | ORAL | Status: DC
  Administered 2023-06-11 – 2023-06-13 (×3): 10 mg via ORAL
  Filled 2023-06-11 (×3): qty 1

## 2023-06-11 MED ORDER — HYDROCHLOROTHIAZIDE 12.5 MG PO TABS
12.5000 mg | ORAL_TABLET | Freq: Every day | ORAL | Status: DC
  Administered 2023-06-11 – 2023-06-13 (×3): 12.5 mg via ORAL
  Filled 2023-06-11 (×3): qty 1

## 2023-06-11 MED ORDER — VANCOMYCIN HCL 1500 MG/300ML IV SOLN
1500.0000 mg | Freq: Two times a day (BID) | INTRAVENOUS | Status: DC
  Administered 2023-06-11 – 2023-06-13 (×5): 1500 mg via INTRAVENOUS
  Filled 2023-06-11 (×5): qty 300

## 2023-06-11 MED ORDER — PRAZOSIN HCL 1 MG PO CAPS
2.0000 mg | ORAL_CAPSULE | Freq: Every day | ORAL | Status: DC
  Administered 2023-06-11 – 2023-06-12 (×2): 2 mg via ORAL
  Filled 2023-06-11 (×4): qty 2

## 2023-06-11 MED ORDER — POTASSIUM CHLORIDE CRYS ER 20 MEQ PO TBCR
40.0000 meq | EXTENDED_RELEASE_TABLET | Freq: Once | ORAL | Status: AC
  Administered 2023-06-11: 40 meq via ORAL
  Filled 2023-06-11: qty 2

## 2023-06-11 NOTE — Assessment & Plan Note (Signed)
-   Continue famotidine

## 2023-06-11 NOTE — H&P (Signed)
History and Physical    Patient: Brandon Strickland ZOX:[id number] DOB: 09-14-1960 DOA: 06/10/2023 DOS: the patient was seen and examined on 06/11/2023 PCP: Hulan Saas, FNP  Patient coming from: Home  Chief Complaint:  Chief Complaint  Patient presents with   Wound Infection   HPI: Brandon Strickland is a 62 y.o. male with medical history significant of recurrent cellulitis, venous insufficiency, hypertension, hyperlipidemia, chronic pain, presents to the ED with a chief complaint of increased pain in the left lower extremity.  Patient reports that Sunday morning he woke up with the top of his leg hurting from his groin all the way down to his foot.  He reports it hurt "like crazy."  He had associated cold chills and shaking.  He reports that the pain was worse with weightbearing, and with palpation.  He describes the pain as sharp and burning.  He took some Tylenol for and it did not help.  He did have a measured fever to 100.4.  He noticed no drainage from the leg at this time.  He reports last time he had cellulitis to leg did drain.  He has been on prophylactic penicillin at baseline.  He is not a diabetic.  He reports he has had some dyspnea, but it sounds like it has been chronic.  He reports has had dyspnea since his knee replacement.  It been progressively worse since then, but no acute worsening recently.  He does use a walker at home.  Patient has no other complaints at this time.  Patient does not smoke.  He drinks alcohol on the weekends.  Patient is full code. Review of Systems: As mentioned in the history of present illness. All other systems reviewed and are negative. Past Medical History:  Diagnosis Date   Family history of breast cancer 01/11/2022   Family history of colon cancer 01/11/2022   Family history of Li-Fraumeni syndrome 01/11/2022   Family history of ovarian cancer 01/11/2022   Hypertension    Past Surgical History:  Procedure Laterality Date   CHOLECYSTECTOMY      HERNIA REPAIR     REPLACEMENT TOTAL KNEE BILATERAL  05/2019   ROTATOR CUFF REPAIR Right    VARICOSE VEIN SURGERY Left    4 surgery   Social History:  reports that he quit smoking about 11 years ago. His smoking use included cigarettes. He has never been exposed to tobacco smoke. He has never used smokeless tobacco. He reports current alcohol use of about 3.0 standard drinks of alcohol per week. He reports that he does not use drugs.  Allergies  Allergen Reactions   Latex Rash    Other reaction(s): rash/itching    Family History  Problem Relation Age of Onset   Diabetes Mellitus II Mother    Breast cancer Mother 51   Breast cancer Maternal Aunt        dx <50, x3 maternal aunts   Ovarian cancer Maternal Aunt        dx <50   Colon cancer Maternal Uncle        dx <50   Leukemia Maternal Uncle        dx <50   Leukemia Paternal Uncle        dx after 81   Breast cancer Cousin        dx <50   Lung cancer Cousin        dx <50   Brain cancer Cousin        dx <50  Cancer - Other Cousin        sarcoma/thigh; ovarian pre cancer? dx <50   Li-Fraumeni syndrome Cousin    Throat cancer Cousin    Colon cancer Cousin    Brain cancer Cousin        d. 6 months    Prior to Admission medications   Medication Sig Start Date End Date Taking? Authorizing Provider  DULoxetine (CYMBALTA) 60 MG capsule Take 60 mg by mouth 2 (two) times daily. 10/05/21   [provider]  famotidine (PEPCID) 20 MG tablet Take 20 mg by mouth daily. 01/12/20   [provider]  gabapentin (NEURONTIN) 300 MG capsule Take 300 mg by mouth 3 (three) times daily.    [provider]  lisinopril-hydrochlorothiazide (ZESTORETIC) 10-12.5 MG tablet Take 1 tablet by mouth daily. 01/12/20   [provider]  penicillin v potassium (VEETID) 500 MG tablet TAKE ONE-HALF TABLET BY MOUTH TWICE A DAY PREVENTION OF CELLULITIS FOR PREVENTION OF CELLULITIS (DOSE IS 250MG ) 12/30/21   [provider]   prazosin (MINIPRESS) 2 MG capsule Take 2 mg by mouth at bedtime.    [provider]  rosuvastatin (CRESTOR) 5 MG tablet Take 5 mg by mouth daily.    [provider]    Physical Exam: Vitals:   06/10/23 1930 06/10/23 1934 06/10/23 2034 06/11/23 0021  BP: 127/69  134/64 124/64  Pulse: 97  99 94  Resp:  18 18   Temp:   98.2 F (36.8 C) 98.2 F (36.8 C)  TempSrc:   Oral Oral  SpO2: 94%  100% 97%  Weight:   128.6 kg   Height:   6\' 4"  (1.93 m)    1.  General: Patient lying supine in bed,  no acute distress   2. Psychiatric: Alert and oriented x 3, mood and behavior normal for situation, pleasant and cooperative with exam   3. Neurologic: Speech and language are normal, face is symmetric, moves all 4 extremities voluntarily, at baseline without acute deficits on limited exam   4. HEENMT:  Head is atraumatic, normocephalic, pupils reactive to light, neck is supple, trachea is midline, mucous membranes are moist   5. Respiratory : Lungs are clear to auscultation bilaterally without wheezing, rhonchi, rales, no cyanosis, no increase in work of breathing or accessory muscle use   6. Cardiovascular : Heart rate normal, rhythm is regular, no murmurs, rubs or gallops, peripheral pulses palpated   7. Gastrointestinal:  Abdomen is soft, nondistended, nontender to palpation bowel sounds active, no masses or organomegaly palpated   8. Skin:  Hemosiderin deposit in the right lower extremity, erythema, edema in the left lower extremity   9.Musculoskeletal:  No acute deformities or trauma, no asymmetry in tone, edema in the left lower extremity peripheral pulses palpated, no tenderness to palpation in the extremities  Data Reviewed: In the ED Afebrile, tachycardic, tachypneic, normotensive Leukocytosis 19.5, hemoglobin 15.0 Chemistry is mostly unremarkable Lactic acid 1.5 Blood culture pending Chest x-ray shows possible parapneumonic effusion EKG shows a heart rate  of 125, sinus tach, QTc 464 with a right bundle branch block Patient was started on cefepime and vancomycin Admission requested for cellulitis  Assessment and Plan: * SIRS (systemic inflammatory response syndrome) (HCC) - Leukocytosis at 19.1, heart rate 129, respiratory rate 24, reported fever at home of 100.6 with chills - Blood cultures pending - Chest x-ray shows some effusions that might be parapneumonic effusions symptoms are not totally consistent with pneumonia - Cefepime and Vanco  started in the ED - Given likely source of cellulitis, continue vancomYcin - Patient is on prophylactic penicillin at baseline -Continue IV fluids - No evidence of endorgan damage, lactic acid 1.5 - Continue to monitor  Chronic pain - Continue Cymbalta for chronic pain and depression  GERD (gastroesophageal reflux disease) - Continue famotidine  Cellulitis - Erythema and edema of the left lower extremity - Increased pain in the left lower extremity - Leukocytosis, tachycardia, tachypnea - Blood culture pending - Patient is on prophylactic penicillin at baseline - Continue vancomycin  Venous insufficiency of left lower extremity - Continue gabapentin for pain control - Continue statin - Patient reports that he is not on aspirin at baseline  Hypertension - Continue lisinopril/hydrochlorothiazide - Continue prazosin      Advance Care Planning:   Code Status: Full Code   Consults: None at this time  Family Communication: No family at bedside  Severity of Illness: The appropriate patient status for this patient is INPATIENT. Inpatient status is judged to be reasonable and necessary in order to provide the required intensity of service to ensure the patient's safety. The patient's presenting symptoms, physical exam findings, and initial radiographic and laboratory data in the context of their chronic comorbidities is felt to place them at high risk for further clinical deterioration.  Furthermore, it is not anticipated that the patient will be medically stable for discharge from the hospital within 2 midnights of admission.   * I certify that at the point of admission it is my clinical judgment that the patient will require inpatient hospital care spanning beyond 2 midnights from the point of admission due to high intensity of service, high risk for further deterioration and high frequency of surveillance required.*  Author: Lilyan Gilford, DO 06/11/2023 3:27 AM  For on call review [email or web address].

## 2023-06-11 NOTE — Assessment & Plan Note (Signed)
-   Leukocytosis at 19.1, heart rate 129, respiratory rate 24, reported fever at home of 100.6 with chills - Blood cultures pending - Chest x-ray shows some effusions that might be parapneumonic effusions symptoms are not totally consistent with pneumonia - Cefepime and Vanco started in the ED - Given likely source of cellulitis, continue vancomYcin - Patient is on prophylactic penicillin at baseline -Continue IV fluids - No evidence of endorgan damage, lactic acid 1.5 - Continue to monitor

## 2023-06-11 NOTE — Progress Notes (Signed)
Progress Note   Patient: Brandon Strickland NWG:[id number] DOB: [birth year] DOA: 06/10/2023     1 DOS: the patient was seen and examined on 06/11/2023   Brief hospital admission course: As Per H&P written by Dr. Carren Rang on 06/10/23 Brandon Strickland is a 62 y.o. male with medical history significant of recurrent cellulitis, venous insufficiency, hypertension, hyperlipidemia, chronic pain, presents to the ED with a chief complaint of increased pain in the left lower extremity.  Patient reports that Sunday morning he woke up with the top of his leg hurting from his groin all the way down to his foot.  He reports it hurt "like crazy."  He had associated cold chills and shaking.  He reports that the pain was worse with weightbearing, and with palpation.  He describes the pain as sharp and burning.  He took some Tylenol for and it did not help.  He did have a measured fever to 100.4.  He noticed no drainage from the leg at this time.  He reports last time he had cellulitis to leg did drain.  He has been on prophylactic penicillin at baseline.  He is not a diabetic.  He reports he has had some dyspnea, but it sounds like it has been chronic.  He reports has had dyspnea since his knee replacement.  It been progressively worse since then, but no acute worsening recently.  He does use a walker at home.  Patient has no other complaints at this time.   Patient does not smoke.  He drinks alcohol on the weekends.  Patient is full code.  Assessment and Plan: * SIRS (systemic inflammatory response syndrome)/sepsis (HCC) - Patient with SIRS/early sepsis at time of admission: Demonstrating leukocytosis at 19.1, heart rate 129, respiratory rate 24 and reporting low-grade temperature/chills at home.  Source of infection identified at left lower extremity cellulitis. -Continue to follow culture result -Continue current IV antibiotics -Follow clinical response -Keep leg elevated much as possible and provide supportive  care. -As needed analgesics will be provided. -Normal lactic acid.  Chronic pain - Continue Cymbalta and home chronic analgesic therapy.  GERD (gastroesophageal reflux disease) - Continue famotidine -Lifestyle modifications discussed with patient.  Cellulitis - Erythema and edema of the left lower extremity appreciated at time of admission. - Continue IV antibiotics as mentioned above -Maintain limb elevated and follow clinical response.  Venous insufficiency of left lower extremity - Continue supportive care -Once cellulitic process resolved would recommend the use of TED hoses/compression socks. -Low-sodium diet and limb elevation discussed with patient.  Hypertension - Continue lisinopril/hydrochlorothiazide and the use of Minipress. -Heart healthy diet discussed with patient.  Subjective:  Afebrile, no chest pain, no nausea or vomiting.  Reports feeling slightly better.  Still with ongoing swelling, redness, warmth sensation to palpation and tenderness in his left lower extremity.  Physical Exam: Vitals:   06/11/23 0403 06/11/23 0817 06/11/23 0920 06/11/23 1204  BP: 130/72 128/67 127/79 134/70  Pulse: 95 95  94  Resp: (!) 22     Temp: 98.5 F (36.9 C) 98 F (36.7 C)  98.4 F (36.9 C)  TempSrc: Oral Oral  Oral  SpO2: 97% 98%  99%  Weight:      Height:       General exam: Alert, awake, oriented x 3; reporting pain and cellulitic process in his left lower extremity.  No nausea or vomiting. Respiratory system: Clear to auscultation. Respiratory effort normal.  Good saturation on room air. Cardiovascular system: Rate controlled, no rubs, no  gallops, no JVD. Gastrointestinal system: Abdomen is obese, nondistended, soft and nontender. No organomegaly or masses felt. Normal bowel sounds heard. Central nervous system: Moving 4 limbs spontaneously.  No focal neurological deficits. Extremities/skin: No cyanosis or clubbing; left lower extremity demonstrating cellulitic  process, swelling, erythematous changes, warmth sensation to palpation and mild tenderness.  No open wound appreciated. Psychiatry: Judgement and insight appear normal. Mood & affect appropriate.   Data Reviewed: CMET: Sodium 136, potassium 3.3, chloride 102, bicarb 25, BUN 16, creatinine 0.76, normal LFTs and GFR >60 CBC: WBCs 19.0, hemoglobin 13.2 and platelet counts 1 86K Magnesium: Within normal limits.  Family Communication: No family at bedside.  Disposition: Status is: Inpatient Remains inpatient appropriate because: Continue IV antibiotics.   Planned Discharge Destination: Home   Time spent: 50 minutes  Author: Vassie Loll, MD 06/11/2023 6:29 PM  For on call review [email or web address]. As per H&P written by Dr.

## 2023-06-11 NOTE — Assessment & Plan Note (Signed)
-   Continue lisinopril/hydrochlorothiazide - Continue prazosin

## 2023-06-11 NOTE — Progress Notes (Signed)
Mobility Specialist Progress Note:    06/11/23 0945  Mobility  Activity Ambulated with assistance in hallway  Level of Assistance Standby assist, set-up cues, supervision of patient - no hands on  Assistive Device Front wheel walker  Distance Ambulated (ft) 40 ft  Range of Motion/Exercises Active;All extremities  Activity Response Tolerated well  Mobility Referral Yes  $Mobility charge 1 Mobility  Mobility Specialist Start Time (ACUTE ONLY) 0945  Mobility Specialist Stop Time (ACUTE ONLY) 1000  Mobility Specialist Time Calculation (min) (ACUTE ONLY) 15 min   Pt received in bed, agreeable to mobility. Required SBA to stand and ambulate with RW. Tolerated well, pt c/o LLE pain and SOB. Standing rest breaks helped to recover SOB. Returned pt to room, left supine. All needs met.   Lawerance Bach Mobility Specialist Please contact via Special educational needs teacher or  Rehab office at [phone]

## 2023-06-11 NOTE — Assessment & Plan Note (Signed)
-   Erythema and edema of the left lower extremity - Increased pain in the left lower extremity - Leukocytosis, tachycardia, tachypnea - Blood culture pending - Patient is on prophylactic penicillin at baseline - Continue vancomycin

## 2023-06-11 NOTE — Assessment & Plan Note (Signed)
-   Continue Cymbalta for chronic pain and depression

## 2023-06-11 NOTE — Progress Notes (Signed)
Patient alert and verbal, ambulated with one assist. Tolerated medications whole with no complaints. Patient reported complaints of pain to left lower leg PRN given for pain, see MAR. Patient reported pain medication was effective.

## 2023-06-11 NOTE — Progress Notes (Signed)
Pharmacy Antibiotic Note  Brandon Strickland is a 62 y.o. male admitted on 06/10/2023 with wound infection with accompanied with fever, general weakness, and increase lowe leg redness and pain. PMH includes history of recurrent cellulitis on prophylactic penicillin VK 500mg  PO BID, chronic venous stasis. Pharmacy has been consulted for vancomycin dosing.  WBC 19, sCr 0.84, lactate 1.5, afebrile since admission reported temp of 100.6 at home. Blood cultures pending  Plan: Ceftriaxone 1g IV every 24 hours per MD Vancomycin 2g IV x1 given in ED Vancomycin 1500mg  IV every 12 hours (AUC 485, IBW, Vd 0.5) Monitor renal function Follow up signs of clinical improvement, LOT, de-escalation of antibiotics  Height: 6\' 4"  (193 cm) Weight: 128.6 kg (283 lb 8.2 oz) IBW/kg (Calculated) : 86.8  Temp (24hrs), Avg:98.5 F (36.9 C), Min:98.1 F (36.7 C), Max:99.4 F (37.4 C)  Recent Labs  Lab 06/10/23 1533  WBC 19.1*  CREATININE 0.84  LATICACIDVEN 1.5    Estimated Creatinine Clearance: 131.8 mL/min (by C-G formula based on SCr of 0.84 mg/dL).    Allergies  Allergen Reactions   Latex Rash    Other reaction(s): rash/itching    Antimicrobials this admission: Ceftriaxone 10/13 >>  Vancomycin 10/13 >>   Microbiology results: 10/13 BCx:   Thank you for allowing pharmacy to be a part of this patient's care.  Arabella Merles, PharmD. Clinical Pharmacist 06/11/2023 3:35 AM

## 2023-06-11 NOTE — Assessment & Plan Note (Signed)
-   Continue gabapentin for pain control - Continue statin - Patient reports that he is not on aspirin at baseline

## 2023-06-11 NOTE — TOC CM/SW Note (Signed)
Transition of Care [facility]) - Inpatient Brief Assessment   Patient Details  Name: Brandon Strickland MRN: [id number] Date of Birth: 05-07-1961  Transition of Care [facility]) CM/SW Contact:    Villa Herb, LCSWA Phone Number: 06/11/2023, 12:51 PM   Clinical Narrative: [facility] notified [facility]ts hospital admission. [facility] notification ID is [phone].   Transition of Care Department [facility]) has reviewed patient and no TOC needs have been identified at this time. We will continue to monitor patient advancement through interdisciplinary progression rounds. If new patient transition needs arise, please place a TOC consult..   Transition of Care Asessment: Insurance and Status: Insurance coverage has been reviewed Patient has primary care physician: Yes Home environment has been reviewed: from home Prior level of function:: independent Prior/Current Home Services: No current home services Social Determinants of Health Reivew: SDOH reviewed no interventions necessary Readmission risk has been reviewed: Yes Transition of care needs: no transition of care needs at this time

## 2023-06-12 DIAGNOSIS — I10 Essential (primary) hypertension: Secondary | ICD-10-CM | POA: Diagnosis not present

## 2023-06-12 DIAGNOSIS — L03116 Cellulitis of left lower limb: Secondary | ICD-10-CM | POA: Diagnosis not present

## 2023-06-12 DIAGNOSIS — K219 Gastro-esophageal reflux disease without esophagitis: Secondary | ICD-10-CM | POA: Diagnosis not present

## 2023-06-12 DIAGNOSIS — R6510 Systemic inflammatory response syndrome (SIRS) of non-infectious origin without acute organ dysfunction: Secondary | ICD-10-CM | POA: Diagnosis not present

## 2023-06-12 LAB — CBC
HCT: 38.3 % — ABNORMAL LOW (ref 39.0–52.0)
Hemoglobin: 12.8 g/dL — ABNORMAL LOW (ref 13.0–17.0)
MCH: 33.5 pg (ref 26.0–34.0)
MCHC: 33.4 g/dL (ref 30.0–36.0)
MCV: 100.3 fL — ABNORMAL HIGH (ref 80.0–100.0)
Platelets: 173 10*3/uL (ref 150–400)
RBC: 3.82 MIL/uL — ABNORMAL LOW (ref 4.22–5.81)
RDW: 13.2 % (ref 11.5–15.5)
WBC: 11.6 10*3/uL — ABNORMAL HIGH (ref 4.0–10.5)
nRBC: 0 % (ref 0.0–0.2)

## 2023-06-12 LAB — BASIC METABOLIC PANEL
Anion gap: 7 (ref 5–15)
BUN: 13 mg/dL (ref 8–23)
CO2: 24 mmol/L (ref 22–32)
Calcium: 8.7 mg/dL — ABNORMAL LOW (ref 8.9–10.3)
Chloride: 102 mmol/L (ref 98–111)
Creatinine, Ser: 0.7 mg/dL (ref 0.61–1.24)
GFR, Estimated: >60 mL/min (ref >60)
Glucose, Bld: 131 mg/dL — ABNORMAL HIGH (ref 70–99)
Potassium: 3.5 mmol/L (ref 3.5–5.1)
Sodium: 133 mmol/L — ABNORMAL LOW (ref 135–145)

## 2023-06-12 NOTE — Progress Notes (Signed)
Progress Note   Patient: Brandon Strickland ZOX:[id number] DOB: Feb 10, 1961 DOA: 06/10/2023     2 DOS: the patient was seen and examined on 06/12/2023   Brief hospital admission course: As Per H&P written by Dr. Carren Rang on 06/10/23 Brandon Strickland is a 62 y.o. male with medical history significant of recurrent cellulitis, venous insufficiency, hypertension, hyperlipidemia, chronic pain, presents to the ED with a chief complaint of increased pain in the left lower extremity.  Patient reports that Sunday morning he woke up with the top of his leg hurting from his groin all the way down to his foot.  He reports it hurt "like crazy."  He had associated cold chills and shaking.  He reports that the pain was worse with weightbearing, and with palpation.  He describes the pain as sharp and burning.  He took some Tylenol for and it did not help.  He did have a measured fever to 100.4.  He noticed no drainage from the leg at this time.  He reports last time he had cellulitis to leg did drain.  He has been on prophylactic penicillin at baseline.  He is not a diabetic.  He reports he has had some dyspnea, but it sounds like it has been chronic.  He reports has had dyspnea since his knee replacement.  It been progressively worse since then, but no acute worsening recently.  He does use a walker at home.  Patient has no other complaints at this time.   Patient does not smoke.  He drinks alcohol on the weekends.  Patient is full code.  Assessment and Plan: * SIRS (systemic inflammatory response syndrome)/sepsis (HCC) - Patient with SIRS/early sepsis at time of admission: Demonstrating leukocytosis at 19.1, heart rate 129, respiratory rate 24 and reporting low-grade temperature/chills at home.  Source of infection identified at left lower extremity cellulitis. -CBC adequately trending down. -Continue current IV antibiotics and follow culture results. -Follow clinical response -Patient has been encourage keep leg  elevated much as possible and provide supportive care. -Continue as needed analgesics will be provided. -Normal lactic acid.  Chronic pain - Continue Cymbalta and home chronic analgesic therapy.  GERD (gastroesophageal reflux disease) - Continue famotidine -Lifestyle modifications discussed with patient.  Cellulitis - Erythema and edema of the left lower extremity appreciated at time of admission. - Continue IV antibiotics as mentioned above -Maintain limb elevated and follow clinical response.  Venous insufficiency of left lower extremity - Continue supportive care -Once cellulitic process resolved would recommend the use of TED hoses/compression socks. -Low-sodium diet and limb elevation discussed with patient.  Hypertension - Continue lisinopril/hydrochlorothiazide and the use of Minipress. -Heart healthy diet discussed with patient.  Subjective:  No fever, no chest pain, no nausea vomiting.  Feeling better but is still experiencing left lower extremity tenderness while bearing weight and noticing swelling and erythematous changes.  Physical Exam: Vitals:   06/11/23 1204 06/11/23 2026 06/12/23 0356 06/12/23 1311  BP: 134/70 115/85 108/61 109/70  Pulse: 94 (!) 102 90 91  Resp:  20 18   Temp: 98.4 F (36.9 C) 99.9 F (37.7 C) 97.8 F (36.6 C) 98.5 F (36.9 C)  TempSrc: Oral   Oral  SpO2: 99% 97% 95% 96%  Weight:      Height:       General exam: Alert, awake, oriented x 3; no chest pain, no nausea, no vomiting.  Reported improvement in his left lower extremity tenderness and swelling.  Still significant erythematous changes appreciated. Respiratory system: Clear  to auscultation. Respiratory effort normal.  Good saturation on room air. Cardiovascular system:RRR. No rubs or gallops; no JVD. Gastrointestinal system: Abdomen is obese, nondistended, soft and nontender. No organomegaly or masses felt. Normal bowel sounds heard. Central nervous system: No focal neurological  deficits. Extremities/skin: No cyanosis or clubbing; tenderness and warmth sensation on palpation in his left lower extremity.  Still with erythematous changes and induration extending up into her Psychiatry: Judgement and insight appear normal. Mood & affect appropriate.   Data Reviewed: Basic metabolic panel: Sodium 133, potassium 3.5, chloride 102, bicarb 24, glucose 131, BUN 13, creatinine 0.70 and GFR >60 CBC: Hemoglobin 12.8, WBCs 11.6, platelet count 173K.   Family Communication: No family at bedside.  Disposition: Status is: Inpatient Remains inpatient appropriate because: Continue IV antibiotics.   Planned Discharge Destination: Home    Time spent: 50 minutes  Author: Vassie Loll, MD 06/12/2023 5:28 PM  For on call review [email or web address]. As per H&P written by Dr.

## 2023-06-12 NOTE — Progress Notes (Signed)
Mobility Specialist Progress Note:    06/12/23 0955  Mobility  Activity Ambulated with assistance in hallway  Level of Assistance Standby assist, set-up cues, supervision of patient - no hands on  Assistive Device Front wheel walker  Distance Ambulated (ft) 70 ft  Range of Motion/Exercises Active;All extremities  Activity Response Tolerated well  Mobility Referral Yes  $Mobility charge 1 Mobility  Mobility Specialist Start Time (ACUTE ONLY) [phone]  Mobility Specialist Stop Time (ACUTE ONLY) 1015  Mobility Specialist Time Calculation (min) (ACUTE ONLY) 20 min   Pt received in bed, agreeable to mobility. Required SBA to stand and ambulate with RW. Tolerated well, LLE pain rated a 6/10 after ambulation. Returned pt to room, all needs met.   Lawerance Bach Mobility Specialist Please contact via Special educational needs teacher or  Rehab office at [phone]

## 2023-06-13 DIAGNOSIS — R6510 Systemic inflammatory response syndrome (SIRS) of non-infectious origin without acute organ dysfunction: Secondary | ICD-10-CM | POA: Diagnosis not present

## 2023-06-13 MED ORDER — DOXYCYCLINE HYCLATE 100 MG PO TABS: 100.0000 mg | ORAL_TABLET | Freq: Two times a day (BID) | ORAL | 0 refills | Status: AC

## 2023-06-13 NOTE — Progress Notes (Signed)
Mobility Specialist Progress Note:    06/13/23 1005  Mobility  Activity Ambulated with assistance in hallway  Level of Assistance Standby assist, set-up cues, supervision of patient - no hands on  Assistive Device Front wheel walker  Distance Ambulated (ft) 120 ft  Range of Motion/Exercises Active;All extremities  Activity Response Tolerated well  Mobility Referral Yes  $Mobility charge 1 Mobility  Mobility Specialist Start Time (ACUTE ONLY) 1005  Mobility Specialist Stop Time (ACUTE ONLY) 1020  Mobility Specialist Time Calculation (min) (ACUTE ONLY) 15 min   Pt received in bed, eager for mobility. Required supervision to stand and ambulate with RW. Tolerated well, asx throughout. Returned pt to room, left supine. All needs met.   Lawerance Bach Mobility Specialist Please contact via Special educational needs teacher or  Rehab office at [phone]

## 2023-06-13 NOTE — Discharge Summary (Signed)
Physician Discharge Summary  Brandon Strickland:[id number] DOB: [birth year] DOA: 06/10/2023  PCP: Hulan Saas, FNP  Admit date: 06/10/2023  Discharge date: 06/13/2023  Admitted From:Home  Disposition:  Home  Recommendations for Outpatient Follow-up:  Follow up with PCP in 1-2 weeks Continue on doxycycline as prescribed for 11 more days to complete 14-day course of treatment for cellulitis Continue other home medications as prior  Home Health: None  Equipment/Devices: None  Discharge Condition:Stable  CODE STATUS: Full  Diet recommendation: Heart Healthy  Brief/Interim Summary: Brandon Strickland is a 62 y.o. male with medical history significant of recurrent cellulitis, venous insufficiency, hypertension, hyperlipidemia, chronic pain, presents to the ED with a chief complaint of increased pain in the left lower extremity.  He was admitted with sepsis, POA secondary to left lower extremity cellulitis and was started on treatment with IV antibiotics.  He now has had significant improvement in his cellulitis and is stable for discharge on oral antibiotics as prescribed above.  No other acute events or concerns noted throughout the course of this admission.  Discharge Diagnoses:  Principal Problem:   SIRS (systemic inflammatory response syndrome) (HCC) Active Problems:   Hypertension   Venous insufficiency of left lower extremity   Cellulitis   GERD (gastroesophageal reflux disease)   Chronic pain  Principal discharge diagnosis: Sepsis, POA, secondary to left lower extremity cellulitis.  Discharge Instructions  Discharge Instructions     Diet - low sodium heart healthy   Complete by: As directed    Increase activity slowly   Complete by: As directed       Allergies as of 06/13/2023       Reactions   Latex Rash   Other reaction(s): rash/itching        Medication List     STOP taking these medications    penicillin v potassium 500 MG tablet Commonly known as:  VEETID       TAKE these medications    acetaminophen 325 MG tablet Commonly known as: TYLENOL Take 650 mg by mouth every 6 (six) hours as needed for moderate pain.   ascorbic acid 500 MG tablet Commonly known as: VITAMIN C Take 500 mg by mouth daily.   cetirizine 10 MG tablet Commonly known as: ZYRTEC Take 10 mg by mouth daily.   doxycycline 100 MG tablet Commonly known as: VIBRA-TABS Take 1 tablet (100 mg total) by mouth 2 (two) times daily for 11 days.   DULoxetine 60 MG capsule Commonly known as: CYMBALTA Take 60 mg by mouth 2 (two) times daily.   famotidine 20 MG tablet Commonly known as: PEPCID Take 20 mg by mouth daily.   gabapentin 300 MG capsule Commonly known as: NEURONTIN Take 300 mg by mouth 3 (three) times daily.   lisinopril-hydrochlorothiazide 10-12.5 MG tablet Commonly known as: ZESTORETIC Take 1 tablet by mouth daily.   prazosin 2 MG capsule Commonly known as: MINIPRESS Take 4 mg by mouth at bedtime.   rosuvastatin 5 MG tablet Commonly known as: CRESTOR Take 5 mg by mouth daily.   triamcinolone ointment 0.1 % Commonly known as: KENALOG Apply topically.   Vitamin D3 1000 units Caps Take 1 capsule by mouth daily.        Follow-up Information     Hulan Saas, FNP. Schedule an appointment as soon as possible for a visit in 1 week(s).   Specialty: Family Medicine Contact information: [address] [place] [address] [phone]  Allergies  Allergen Reactions   Latex Rash    Other reaction(s): rash/itching    Consultations: None   Procedures/Studies: DG Chest Port 1 View  Result Date: 06/10/2023 CLINICAL DATA:  Sepsis EXAM: PORTABLE CHEST 1 VIEW COMPARISON:  Chest x-ray 03/25/2021 FINDINGS: There is a small left pleural effusion and minimal patchy opacities in the left lung base. The right lung is clear. The heart is mildly enlarged, unchanged. There is no pneumothorax or acute fracture.  IMPRESSION: Small left pleural effusion and minimal patchy opacities in the left lung base which may reflect atelectasis or infection. Electronically Signed   By: Darliss Cheney M.D.   On: 06/10/2023 19:15     Discharge Exam: Vitals:   06/12/23 2116 06/13/23 0430  BP: 126/72 113/71  Pulse: 89 92  Resp: 20 20  Temp: 99.1 F (37.3 C) 98.9 F (37.2 C)  SpO2: 98% 96%   Vitals:   06/12/23 0356 06/12/23 1311 06/12/23 2116 06/13/23 0430  BP: 108/61 109/70 126/72 113/71  Pulse: 90 91 89 92  Resp: 18  20 20   Temp: 97.8 F (36.6 C) 98.5 F (36.9 C) 99.1 F (37.3 C) 98.9 F (37.2 C)  TempSrc:  Oral Oral   SpO2: 95% 96% 98% 96%  Weight:      Height:        General: Pt is alert, awake, not in acute distress Cardiovascular: RRR, S1/S2 +, no rubs, no gallops Respiratory: CTA bilaterally, no wheezing, no rhonchi Abdominal: Soft, NT, ND, bowel sounds + Extremities: Left lower extremity erythema with no edema-improved    The results of significant diagnostics from this hospitalization (including imaging, microbiology, ancillary and laboratory) are listed below for reference.     Microbiology: Recent Results (from the past 240 hour(s))  Blood culture (routine x 2)     Status: None (Preliminary result)   Collection Time: 06/10/23  3:33 PM   Specimen: Right Antecubital; Blood  Result Value Ref Range Status   Specimen Description   Final    RIGHT ANTECUBITAL BOTTLES DRAWN AEROBIC AND ANAEROBIC   Special Requests Blood Culture adequate volume  Final   Culture   Final    NO GROWTH 3 DAYS Performed at [facility], [address], [place], [place] [address]    Report Status PENDING  Incomplete  Blood culture (routine x 2)     Status: None (Preliminary result)   Collection Time: 06/10/23  3:33 PM   Specimen: BLOOD RIGHT HAND  Result Value Ref Range Status   Specimen Description   Final    BLOOD RIGHT HAND BOTTLES DRAWN AEROBIC AND ANAEROBIC   Special Requests Blood Culture adequate  volume  Final   Culture   Final    NO GROWTH 3 DAYS Performed at [facility], [address], [place], [place] [address]    Report Status PENDING  Incomplete  SARS Coronavirus 2 by RT PCR (hospital order, performed in [facility] hospital lab) *cepheid single result test* Anterior Nasal Swab     Status: None   Collection Time: 06/10/23 11:39 PM   Specimen: Anterior Nasal Swab  Result Value Ref Range Status   SARS Coronavirus 2 by RT PCR NEGATIVE NEGATIVE Final    Comment: (NOTE) SARS-CoV-2 target nucleic acids are NOT DETECTED.  The SARS-CoV-2 RNA is generally detectable in upper and lower respiratory specimens during the acute phase of infection. The lowest concentration of SARS-CoV-2 viral copies this assay can detect is 250 copies / mL. A negative result does  not preclude SARS-CoV-2 infection and should not be used as the sole basis for treatment or other patient management decisions.  A negative result may occur with improper specimen collection / handling, submission of specimen other than nasopharyngeal swab, presence of viral mutation(s) within the areas targeted by this assay, and inadequate number of viral copies (<250 copies / mL). A negative result must be combined with clinical observations, patient history, and epidemiological information.  Fact Sheet for Patients:   RoadLapTop.co.za  Fact Sheet for Healthcare Providers: http://kim-miller.com/  This test is not yet approved or  cleared by the [place] FDA and has been authorized for detection and/or diagnosis of SARS-CoV-2 by FDA under an Emergency Use Authorization (EUA).  This EUA will remain in effect (meaning this test can be used) for the duration of the COVID-19 declaration under Section 564(b)(1) of the Act, 21 U.S.C. section 360bbb-3(b)(1), unless the authorization is terminated or revoked sooner.  Performed at [facility], [address] [place],  [place] [address]      Labs: BNP (last 3 results) No results for input(s): "BNP" in the last 8760 hours. Basic Metabolic Panel: Recent Labs  Lab 06/10/23 1533 06/11/23 0413 06/12/23 0427  NA 135 136 133*  K 3.7 3.3* 3.5  CL 100 102 102  CO2 26 25 24   GLUCOSE 141* 116* 131*  BUN 17 16 13   CREATININE 0.84 0.76 0.70  CALCIUM 9.2 8.9 8.7*  MG  --  1.9  --    Liver Function Tests: Recent Labs  Lab 06/10/23 1533 06/11/23 0413  AST 29 22  ALT 27 22  ALKPHOS 67 51  BILITOT 1.1 0.9  PROT 7.8 6.5  ALBUMIN 3.9 3.3*   No results for input(s): "LIPASE", "AMYLASE" in the last 168 hours. No results for input(s): "AMMONIA" in the last 168 hours. CBC: Recent Labs  Lab 06/10/23 1533 06/11/23 0413 06/12/23 0427  WBC 19.1* 19.0* 11.6*  NEUTROABS 17.6*  --   --   HGB 15.0 13.2 12.8*  HCT 44.4 40.3 38.3*  MCV 99.3 101.0* 100.3*  PLT 198 186 173   Cardiac Enzymes: No results for input(s): "CKTOTAL", "CKMB", "CKMBINDEX", "TROPONINI" in the last 168 hours. BNP: Invalid input(s): "POCBNP" CBG: No results for input(s): "GLUCAP" in the last 168 hours. D-Dimer No results for input(s): "DDIMER" in the last 72 hours. Hgb A1c No results for input(s): "HGBA1C" in the last 72 hours. Lipid Profile No results for input(s): "CHOL", "HDL", "LDLCALC", "TRIG", "CHOLHDL", "LDLDIRECT" in the last 72 hours. Thyroid function studies No results for input(s): "TSH", "T4TOTAL", "T3FREE", "THYROIDAB" in the last 72 hours.  Invalid input(s): "FREET3" Anemia work up No results for input(s): "VITAMINB12", "FOLATE", "FERRITIN", "TIBC", "IRON", "RETICCTPCT" in the last 72 hours. Urinalysis    Component Value Date/Time   COLORURINE YELLOW 06/11/2023 0915   APPEARANCEUR CLEAR 06/11/2023 0915   LABSPEC 1.027 06/11/2023 0915   PHURINE 6.0 06/11/2023 0915   GLUCOSEU NEGATIVE 06/11/2023 0915   HGBUR NEGATIVE 06/11/2023 0915   BILIRUBINUR NEGATIVE 06/11/2023 0915   KETONESUR NEGATIVE 06/11/2023 0915    PROTEINUR NEGATIVE 06/11/2023 0915   NITRITE NEGATIVE 06/11/2023 0915   LEUKOCYTESUR NEGATIVE 06/11/2023 0915   Sepsis Labs Recent Labs  Lab 06/10/23 1533 06/11/23 0413 06/12/23 0427  WBC 19.1* 19.0* 11.6*   Microbiology Recent Results (from the past 240 hour(s))  Blood culture (routine x 2)     Status: None (Preliminary result)   Collection Time: 06/10/23  3:33 PM   Specimen: Right Antecubital;  Blood  Result Value Ref Range Status   Specimen Description   Final    RIGHT ANTECUBITAL BOTTLES DRAWN AEROBIC AND ANAEROBIC   Special Requests Blood Culture adequate volume  Final   Culture   Final    NO GROWTH 3 DAYS Performed at [facility], [address], [place], [place] [address]    Report Status PENDING  Incomplete  Blood culture (routine x 2)     Status: None (Preliminary result)   Collection Time: 06/10/23  3:33 PM   Specimen: BLOOD RIGHT HAND  Result Value Ref Range Status   Specimen Description   Final    BLOOD RIGHT HAND BOTTLES DRAWN AEROBIC AND ANAEROBIC   Special Requests Blood Culture adequate volume  Final   Culture   Final    NO GROWTH 3 DAYS Performed at [facility], [address], [place], [place] [address]    Report Status PENDING  Incomplete  SARS Coronavirus 2 by RT PCR (hospital order, performed in [facility] hospital lab) *cepheid single result test* Anterior Nasal Swab     Status: None   Collection Time: 06/10/23 11:39 PM   Specimen: Anterior Nasal Swab  Result Value Ref Range Status   SARS Coronavirus 2 by RT PCR NEGATIVE NEGATIVE Final    Comment: (NOTE) SARS-CoV-2 target nucleic acids are NOT DETECTED.  The SARS-CoV-2 RNA is generally detectable in upper and lower respiratory specimens during the acute phase of infection. The lowest concentration of SARS-CoV-2 viral copies this assay can detect is 250 copies / mL. A negative result does not preclude SARS-CoV-2 infection and should not be used as the sole basis for treatment or other patient  management decisions.  A negative result may occur with improper specimen collection / handling, submission of specimen other than nasopharyngeal swab, presence of viral mutation(s) within the areas targeted by this assay, and inadequate number of viral copies (<250 copies / mL). A negative result must be combined with clinical observations, patient history, and epidemiological information.  Fact Sheet for Patients:   RoadLapTop.co.za  Fact Sheet for Healthcare Providers: http://kim-miller.com/  This test is not yet approved or  cleared by the [place] FDA and has been authorized for detection and/or diagnosis of SARS-CoV-2 by FDA under an Emergency Use Authorization (EUA).  This EUA will remain in effect (meaning this test can be used) for the duration of the COVID-19 declaration under Section 564(b)(1) of the Act, 21 U.S.C. section 360bbb-3(b)(1), unless the authorization is terminated or revoked sooner.  Performed at [facility], [address], [place], [place] [address]      Time coordinating discharge: 35 minutes  SIGNED:   Erick Blinks, DO [facility] 06/13/2023, 10:13 AM  If 7PM-7AM, please contact night-coverage [email or web address]

## 2023-06-15 LAB — CULTURE, BLOOD (ROUTINE X 2)
Culture: NO GROWTH
Culture: NO GROWTH
Special Requests: ADEQUATE
Special Requests: ADEQUATE

## 2023-06-18 ENCOUNTER — Emergency Department (HOSPITAL_COMMUNITY)
Admission: EM | Admit: 2023-06-18 | Discharge: 2023-06-19 | Disposition: A | Discharge status: Home / Self Care | Payer: No Typology Code available for payment source | Attending: Emergency Medicine | Admitting: Emergency Medicine

## 2023-06-18 ENCOUNTER — Encounter (HOSPITAL_COMMUNITY): Payer: Self-pay

## 2023-06-18 ENCOUNTER — Other Ambulatory Visit: Payer: Self-pay

## 2023-06-18 ENCOUNTER — Emergency Department (HOSPITAL_COMMUNITY): Payer: No Typology Code available for payment source

## 2023-06-18 DIAGNOSIS — M436 Torticollis: Secondary | ICD-10-CM | POA: Diagnosis not present

## 2023-06-18 DIAGNOSIS — M542 Cervicalgia: Secondary | ICD-10-CM | POA: Diagnosis present

## 2023-06-18 LAB — CBC WITH DIFFERENTIAL/PLATELET
Abs Immature Granulocytes: 0.28 10*3/uL — ABNORMAL HIGH (ref 0.00–0.07)
Basophils Absolute: 0.1 10*3/uL (ref 0.0–0.1)
Basophils Relative: 1 %
Eosinophils Absolute: 0.2 10*3/uL (ref 0.0–0.5)
Eosinophils Relative: 2 %
HCT: 45.3 % (ref 39.0–52.0)
Hemoglobin: 14.7 g/dL (ref 13.0–17.0)
Immature Granulocytes: 2 %
Lymphocytes Relative: 18 %
Lymphs Abs: 2.3 10*3/uL (ref 0.7–4.0)
MCH: 33 pg (ref 26.0–34.0)
MCHC: 32.5 g/dL (ref 30.0–36.0)
MCV: 101.6 fL — ABNORMAL HIGH (ref 80.0–100.0)
Monocytes Absolute: 0.8 10*3/uL (ref 0.1–1.0)
Monocytes Relative: 6 %
Neutro Abs: 9.3 10*3/uL — ABNORMAL HIGH (ref 1.7–7.7)
Neutrophils Relative %: 71 %
Platelets: 305 10*3/uL (ref 150–400)
RBC: 4.46 MIL/uL (ref 4.22–5.81)
RDW: 13 % (ref 11.5–15.5)
WBC: 12.9 10*3/uL — ABNORMAL HIGH (ref 4.0–10.5)
nRBC: 0 % (ref 0.0–0.2)

## 2023-06-18 LAB — BASIC METABOLIC PANEL
Anion gap: 9 (ref 5–15)
BUN: 21 mg/dL (ref 8–23)
CO2: 27 mmol/L (ref 22–32)
Calcium: 9.5 mg/dL (ref 8.9–10.3)
Chloride: 101 mmol/L (ref 98–111)
Creatinine, Ser: 0.78 mg/dL (ref 0.61–1.24)
GFR, Estimated: >60 mL/min (ref >60)
Glucose, Bld: 115 mg/dL — ABNORMAL HIGH (ref 70–99)
Potassium: 3.9 mmol/L (ref 3.5–5.1)
Sodium: 137 mmol/L (ref 135–145)

## 2023-06-18 NOTE — ED Triage Notes (Signed)
C/o left sided of neck pain/stiffness radiating into left shoulder and left side of head x3 days.  Also c/o headache. Patient reports started doxycycline on 10/16 Denies n/v

## 2023-06-19 MED ORDER — HYDROCODONE-ACETAMINOPHEN 5-325 MG PO TABS
ORAL_TABLET | ORAL | 0 refills | Status: AC
Start: 2023-06-19 — End: ?

## 2023-06-19 MED ORDER — METHOCARBAMOL 500 MG PO TABS
500.0000 mg | ORAL_TABLET | Freq: Three times a day (TID) | ORAL | 0 refills | Status: AC
Start: 2023-06-19 — End: ?

## 2023-06-19 NOTE — Discharge Instructions (Signed)
You may try over-the-counter muscle creams or rubs to your neck or apply warm heat such as a heating pad off-and-on.  You have been prescribed medication for pain and a muscle relaxer to help with your symptoms.  Please follow-up with your primary care provider for recheck or return to the emergency department for any new or worsening symptoms.

## 2023-06-21 NOTE — ED Provider Notes (Signed)
[facility] EMERGENCY DEPARTMENT AT [facility] Provider Note   CSN: [id number] Arrival date & time: 06/18/23  1328     History  Chief Complaint  Patient presents with   Torticollis    Devion Sabal is a 62 y.o. male.  HPI      Xane Maruska is a 62 y.o. male who presents to the Emergency Department complaining of pain to the left side of his neck with stiffness and pain radiating to the top of the left shoulder.  Symptoms present for 3 days.  Mild frontal headache.  Pain reproduced with movement or turning of his head.  Patient admitted to the hospital on 06/10/2023 for sepsis secondary to cellulitis left lower extremity was discharged 3 days later sent home on doxycycline.  States that his symptoms started shortly after starting the doxycycline and he is unsure if the medication is the cause of his neck pain.  Denies any nausea vomiting fever or chills.  Endorses improvement of his lower extremity cellulitis.    Home Medications Prior to Admission medications   Medication Sig Start Date End Date Taking? Authorizing Provider  HYDROcodone-acetaminophen (NORCO/VICODIN) 5-325 MG tablet Take one tab po q 4 hrs prn pain 06/19/23  Yes Draysen Weygandt, PA-C  methocarbamol (ROBAXIN) 500 MG tablet Take 1 tablet (500 mg total) by mouth 3 (three) times daily. 06/19/23  Yes Myca Perno, PA-C  ascorbic acid (VITAMIN C) 500 MG tablet Take 500 mg by mouth daily.    [provider]  cetirizine (ZYRTEC) 10 MG tablet Take 10 mg by mouth daily.    [provider]  Cholecalciferol (VITAMIN D3) 1000 units CAPS Take 1 capsule by mouth daily.    [provider]  doxycycline (VIBRA-TABS) 100 MG tablet Take 1 tablet (100 mg total) by mouth 2 (two) times daily for 11 days. 06/13/23 06/24/23  Sherryll Burger, Pratik D, DO  DULoxetine (CYMBALTA) 60 MG capsule Take 60 mg by mouth 2 (two) times daily. 10/05/21   [provider]  famotidine (PEPCID) 20 MG tablet Take 20 mg by  mouth daily. 01/12/20   [provider]  gabapentin (NEURONTIN) 300 MG capsule Take 300 mg by mouth 3 (three) times daily.    [provider]  lisinopril-hydrochlorothiazide (ZESTORETIC) 10-12.5 MG tablet Take 1 tablet by mouth daily. 01/12/20   [provider]  prazosin (MINIPRESS) 2 MG capsule Take 4 mg by mouth at bedtime.    [provider]  rosuvastatin (CRESTOR) 5 MG tablet Take 5 mg by mouth daily.    [provider]  triamcinolone ointment (KENALOG) 0.1 % Apply topically. 06/06/22   [provider]      Allergies    Latex    Review of Systems   Review of Systems  Constitutional:  Negative for chills and fever.  Respiratory:  Negative for shortness of breath.   Cardiovascular:  Negative for chest pain.  Gastrointestinal:  Negative for abdominal pain, nausea and vomiting.  Musculoskeletal:  Positive for neck pain and neck stiffness.  Skin:  Negative for color change, rash and wound.  Neurological:  Positive for headaches. Negative for dizziness, syncope, facial asymmetry, speech difficulty, weakness and numbness.    Physical Exam Updated Vital Signs BP 122/70 (BP Location: Right Arm)   Pulse 80   Temp 98.2 F (36.8 C) (Oral)   Resp 16   Wt 136.1 kg   SpO2 98%   BMI 36.52 kg/m  Physical Exam Vitals and nursing note reviewed.  Constitutional:  General: He is not in acute distress.    Appearance: Normal appearance. He is not ill-appearing or toxic-appearing.  Neck:     Trachea: Phonation normal.     Meningeal: Kernig's sign absent.     Comments: Tenderness to palpation lateral aspect of the left cervical paraspinal muscles tender along the upper rhomboid muscles on the left.  No midline tenderness or bony deformity.  No meningeal signs. Cardiovascular:     Rate and Rhythm: Normal rate and regular rhythm.     Pulses: Normal pulses.  Pulmonary:     Effort: Pulmonary effort is normal.  Chest:     Chest wall: No  tenderness.  Abdominal:     Palpations: Abdomen is soft.     Tenderness: There is no abdominal tenderness.  Musculoskeletal:     Cervical back: Torticollis and tenderness present. No edema or rigidity. Muscular tenderness present. No pain with movement.  Skin:    General: Skin is warm.     Capillary Refill: Capillary refill takes less than 2 seconds.     Findings: No rash.  Neurological:     General: No focal deficit present.     Mental Status: He is alert.     Sensory: No sensory deficit.     Motor: No weakness.     ED Results / Procedures / Treatments   Labs (all labs ordered are listed, but only abnormal results are displayed) Labs Reviewed  CBC WITH DIFFERENTIAL/PLATELET - Abnormal; Notable for the following components:      Result Value   WBC 12.9 (*)    MCV 101.6 (*)    Neutro Abs 9.3 (*)    Abs Immature Granulocytes 0.28 (*)    All other components within normal limits  BASIC METABOLIC PANEL - Abnormal; Notable for the following components:   Glucose, Bld 115 (*)    All other components within normal limits    EKG None  Radiology DG Cervical Spine Complete  Result Date: 06/19/2023 CLINICAL DATA:  neck pain EXAM: CERVICAL SPINE - COMPLETE 4+ VIEW COMPARISON:  None Available. FINDINGS: Limited evaluation due to overlapping osseous structures and overlying soft tissues. There is no evidence of cervical spine fracture or prevertebral soft tissue swelling. Alignment is normal. Multilevel moderate degenerative changes of the spine. Multilevel mild to moderate osseous neural foraminal stenosis. No other significant bone abnormalities are identified. IMPRESSION: Limited evaluation due to overlapping osseous structures and overlying soft tissues. No acute displaced fracture or traumatic listhesis of the cervical spine. Electronically Signed   By: Tish Frederickson M.D.   On: 06/19/2023 00:08    Procedures Procedures    Medications Ordered in ED Medications - No data to  display  ED Course/ Medical Decision Making/ A&P                                 Medical Decision Making Patient here with unilateral tenderness cervical paraspinal muscles.  Pain reproducible with palpation and movement.  Neurovascularly intact.  States his symptoms began after starting doxycycline denies known injury concerned that medication is the cause.  Suspect torticollis.  Meningitis musculoskeletal injury also considered.  No radicular symptoms on exam.  Amount and/or Complexity of Data Reviewed Labs: ordered.    Details: Labs without acute derangement Radiology: ordered.    Details: X-ray of the cervical spine without fracture or traumatic listhesis Discussion of management or test interpretation with external provider(s): Suspect torticollis.  Offered patient different antibiotic, but patient states cellulitis of his leg has improved with the doxycycline he prefers symptomatic treatment of his neck pain.  Doubt emergent process.  Patient appropriate for discharge home return precautions were given will follow-up with PCP.  Risk Prescription drug management.           Final Clinical Impression(s) / ED Diagnoses Final diagnoses:  Torticollis    Rx / DC Orders ED Discharge Orders          Ordered    HYDROcodone-acetaminophen (NORCO/VICODIN) 5-325 MG tablet        06/19/23 0032    methocarbamol (ROBAXIN) 500 MG tablet  3 times daily        06/19/23 0032              Pauline Aus, PA-C 06/21/23 1122    Bethann Berkshire, MD 06/26/23 [phone]

## 2023-06-25 ENCOUNTER — Other Ambulatory Visit (HOSPITAL_COMMUNITY)
Admission: RE | Admit: 2023-06-25 | Discharge: 2023-06-25 | Disposition: A | Discharge status: Home / Self Care | Payer: No Typology Code available for payment source | Source: Ambulatory Visit | Attending: Internal Medicine | Admitting: Internal Medicine

## 2023-06-25 DIAGNOSIS — I10 Essential (primary) hypertension: Secondary | ICD-10-CM | POA: Insufficient documentation

## 2023-06-25 LAB — BASIC METABOLIC PANEL
Anion gap: 9 (ref 5–15)
BUN: 15 mg/dL (ref 8–23)
CO2: 26 mmol/L (ref 22–32)
Calcium: 9.4 mg/dL (ref 8.9–10.3)
Chloride: 100 mmol/L (ref 98–111)
Creatinine, Ser: 0.73 mg/dL (ref 0.61–1.24)
GFR, Estimated: >60 mL/min (ref >60)
Glucose, Bld: 91 mg/dL (ref 70–99)
Potassium: 4 mmol/L (ref 3.5–5.1)
Sodium: 135 mmol/L (ref 135–145)

## 2023-06-26 ENCOUNTER — Ambulatory Visit (HOSPITAL_COMMUNITY): Payer: No Typology Code available for payment source | Admitting: Certified Registered Nurse Anesthetist

## 2023-06-26 ENCOUNTER — Ambulatory Visit (HOSPITAL_COMMUNITY)
Admission: RE | Admit: 2023-06-26 | Discharge: 2023-06-26 | Disposition: A | Discharge status: Home / Self Care | Payer: No Typology Code available for payment source | Attending: Internal Medicine | Admitting: Internal Medicine

## 2023-06-26 ENCOUNTER — Encounter (HOSPITAL_COMMUNITY)
Admission: RE | Disposition: A | Discharge status: Home / Self Care | Payer: Self-pay | Source: Home / Self Care | Attending: Internal Medicine

## 2023-06-26 ENCOUNTER — Other Ambulatory Visit: Payer: Self-pay

## 2023-06-26 ENCOUNTER — Encounter (HOSPITAL_COMMUNITY): Payer: Self-pay

## 2023-06-26 DIAGNOSIS — K5730 Diverticulosis of large intestine without perforation or abscess without bleeding: Secondary | ICD-10-CM | POA: Diagnosis not present

## 2023-06-26 DIAGNOSIS — D126 Benign neoplasm of colon, unspecified: Secondary | ICD-10-CM | POA: Diagnosis not present

## 2023-06-26 DIAGNOSIS — D125 Benign neoplasm of sigmoid colon: Secondary | ICD-10-CM

## 2023-06-26 DIAGNOSIS — Z87891 Personal history of nicotine dependence: Secondary | ICD-10-CM | POA: Insufficient documentation

## 2023-06-26 DIAGNOSIS — K219 Gastro-esophageal reflux disease without esophagitis: Secondary | ICD-10-CM | POA: Diagnosis not present

## 2023-06-26 DIAGNOSIS — Z1211 Encounter for screening for malignant neoplasm of colon: Secondary | ICD-10-CM

## 2023-06-26 DIAGNOSIS — K648 Other hemorrhoids: Secondary | ICD-10-CM | POA: Diagnosis not present

## 2023-06-26 DIAGNOSIS — Z800 Family history of malignant neoplasm of digestive organs: Secondary | ICD-10-CM | POA: Insufficient documentation

## 2023-06-26 DIAGNOSIS — D124 Benign neoplasm of descending colon: Secondary | ICD-10-CM | POA: Diagnosis not present

## 2023-06-26 DIAGNOSIS — D123 Benign neoplasm of transverse colon: Secondary | ICD-10-CM | POA: Diagnosis not present

## 2023-06-26 DIAGNOSIS — I10 Essential (primary) hypertension: Secondary | ICD-10-CM | POA: Diagnosis not present

## 2023-06-26 HISTORY — PX: COLONOSCOPY WITH PROPOFOL: SHX5780

## 2023-06-26 HISTORY — PX: POLYPECTOMY: SHX5525

## 2023-06-26 SURGERY — COLONOSCOPY WITH PROPOFOL
Anesthesia: General

## 2023-06-26 MED ORDER — SODIUM CHLORIDE 0.9% FLUSH: 10.0000 mL | Freq: Two times a day (BID) | INTRAVENOUS | Status: DC

## 2023-06-26 MED ORDER — LACTATED RINGERS IV SOLN: INTRAVENOUS | Status: DC | PRN

## 2023-06-26 MED ORDER — PROPOFOL 500 MG/50ML IV EMUL
INTRAVENOUS | Status: DC | PRN
  Administered 2023-06-26: 150 ug/kg/min via INTRAVENOUS

## 2023-06-26 MED ORDER — PROPOFOL 500 MG/50ML IV EMUL
INTRAVENOUS | Status: AC
  Filled 2023-06-26: qty 50

## 2023-06-26 MED ORDER — PROPOFOL 10 MG/ML IV BOLUS
INTRAVENOUS | Status: DC | PRN
  Administered 2023-06-26: 100 mg via INTRAVENOUS
  Administered 2023-06-26: 50 mg via INTRAVENOUS

## 2023-06-26 MED ORDER — STERILE WATER FOR IRRIGATION IR SOLN
Status: DC | PRN
  Administered 2023-06-26: 120 mL

## 2023-06-26 NOTE — Transfer of Care (Addendum)
Immediate Anesthesia Transfer of Care Note  Patient: Brandon Strickland  Procedure(s) Performed: COLONOSCOPY WITH PROPOFOL POLYPECTOMY  Patient Location: PACU  Anesthesia Type: MAC  Level of Consciousness: awake, alert , and oriented  Airway & Oxygen Therapy: Patient Spontanous Breathing  Post-op Assessment: Report given to RN and Post -op Vital signs reviewed and stable  Post vital signs: Reviewed  Last Vitals:  Vitals Value Taken Time  BP 127/66 06/26/23 1225  Temp 36.6 C 06/26/23 1225  Pulse 90 06/26/23 1225  Resp 18 06/26/23 1225  SpO2 96 % 06/26/23 1225    Last Pain:  Vitals:   06/26/23 1225  TempSrc: Oral  PainSc: 0-No pain      Patients Stated Pain Goal: 4 (06/26/23 1110)  Complications: No notable events documented.

## 2023-06-26 NOTE — Op Note (Signed)
[facility] Patient Name: Brandon Strickland Procedure Date: 06/26/2023 11:33 AM MRN: [id number] Date of Birth: 01/16/1961 Attending MD: Hennie Duos. Marletta Lor , [place], [id number] CSN: [id number] Age: 62 Admit Type: Outpatient Procedure:                Colonoscopy Indications:              Screening for colorectal malignant neoplasm Providers:                Hennie Duos. Marletta Lor, DO, Sheran Fava, Lennice Sites Technician, Technician Referring MD:              Medicines:                See the Anesthesia note for documentation of the                            administered medications Complications:            No immediate complications. Estimated Blood Loss:     Estimated blood loss was minimal. Procedure:                Pre-Anesthesia Assessment:                           - The anesthesia plan was to use monitored                            anesthesia care (MAC).                           After obtaining informed consent, the colonoscope                            was passed under direct vision. Throughout the                            procedure, the patient's blood pressure, pulse, and                            oxygen saturations were monitored continuously. The                            [id number]-HQ190L ([id number]) scope was introduced through                            the anus and advanced to the the cecum, identified                            by appendiceal orifice and ileocecal valve. The                            colonoscopy was technically difficult and complex                            due to a redundant colon  and significant looping.                            Successful completion of the procedure was aided by                            applying abdominal pressure. The patient tolerated                            the procedure well. The quality of the bowel                            preparation was evaluated using the BBPS [facility]                             Bowel Preparation Scale) with scores of: Right                            Colon = 2 (minor amount of residual staining, small                            fragments of stool and/or opaque liquid, but mucosa                            seen well), Transverse Colon = 2 (minor amount of                            residual staining, small fragments of stool and/or                            opaque liquid, but mucosa seen well) and Left Colon                            = 3 (entire mucosa seen well with no residual                            staining, small fragments of stool or opaque                            liquid). The total BBPS score equals 7. The quality                            of the bowel preparation was good. Scope In: 11:47:46 AM Scope Out: 12:20:57 PM Scope Withdrawal Time: 0 hours 25 minutes 29 seconds  Total Procedure Duration: 0 hours 33 minutes 11 seconds  Findings:      Non-bleeding internal hemorrhoids were found during endoscopy.      Many large-mouthed and small-mouthed diverticula were found in the       sigmoid colon, descending colon and transverse colon.      Three sessile polyps were found in the transverse colon. The polyps were       4 to 6 mm in size. These polyps were removed with a cold snare.  Resection and retrieval were complete.      Three sessile polyps were found in the sigmoid colon and descending       colon. The polyps were 3 to 5 mm in size. These polyps were removed with       a cold snare. Resection and retrieval were complete.      The exam was otherwise without abnormality. Impression:               - Non-bleeding internal hemorrhoids.                           - Diverticulosis in the sigmoid colon, in the                            descending colon and in the transverse colon.                           - Three 4 to 6 mm polyps in the transverse colon,                            removed with a cold snare. Resected and retrieved.                            - Three 3 to 5 mm polyps in the sigmoid colon and                            in the descending colon, removed with a cold snare.                            Resected and retrieved.                           - The examination was otherwise normal. Moderate Sedation:      Per Anesthesia Care Recommendation:           - Patient has a contact number available for                            emergencies. The signs and symptoms of potential                            delayed complications were discussed with the                            patient. Return to normal activities tomorrow.                            Written discharge instructions were provided to the                            patient.                           - Resume previous diet.                           -  Continue present medications.                           - Await pathology results.                           - Repeat colonoscopy in 3 - 5 years for                            surveillance.                           - Return to GI clinic PRN. Procedure Code(s):        --- Professional ---                           [phone], Colonoscopy, flexible; with removal of                            tumor(s), polyp(s), or other lesion(s) by snare                            technique Diagnosis Code(s):        --- Professional ---                           Z12.11, Encounter for screening for malignant                            neoplasm of colon                           K64.8, Other hemorrhoids                           D12.3, Benign neoplasm of transverse colon (hepatic                            flexure or splenic flexure)                           D12.5, Benign neoplasm of sigmoid colon                           D12.4, Benign neoplasm of descending colon                           K57.30, Diverticulosis of large intestine without                            perforation or abscess without bleeding CPT copyright 2022  American Medical Association. All rights reserved. The codes documented in this report are preliminary and upon coder review may  be revised to meet current compliance requirements. Hennie Duos. Marletta Lor, DO Hennie Duos. Marletta Lor, DO 06/26/2023 12:27:32 PM This report has been signed electronically. Number of Addenda: 0

## 2023-06-26 NOTE — H&P (Signed)
Primary Care Physician:  Hulan Saas, FNP Primary Gastroenterologist:  Dr. Marletta Lor  Pre-Procedure History & Physical: HPI:  Brandon Strickland is a 62 y.o. male is here for a colonoscopy for colon cancer screening purposes.  Patient denies any family history of colorectal cancer.  No melena or hematochezia.  No abdominal pain or unintentional weight loss.  No change in bowel habits.  Overall feels well from a GI standpoint.  Past Medical History:  Diagnosis Date   Family history of breast cancer 01/11/2022   Family history of colon cancer 01/11/2022   Family history of Li-Fraumeni syndrome 01/11/2022   Family history of ovarian cancer 01/11/2022   Hypertension     Past Surgical History:  Procedure Laterality Date   CHOLECYSTECTOMY     HERNIA REPAIR     REPLACEMENT TOTAL KNEE BILATERAL  05/2019   ROTATOR CUFF REPAIR Right    VARICOSE VEIN SURGERY Left    4 surgery    Prior to Admission medications   Medication Sig Start Date End Date Taking? Authorizing Provider  ascorbic acid (VITAMIN C) 500 MG tablet Take 500 mg by mouth daily.   Yes [provider]  cetirizine (ZYRTEC) 10 MG tablet Take 10 mg by mouth daily.   Yes [provider]  Cholecalciferol (VITAMIN D3) 1000 units CAPS Take 1 capsule by mouth daily.   Yes [provider]  DULoxetine (CYMBALTA) 60 MG capsule Take 60 mg by mouth 2 (two) times daily. 10/05/21  Yes [provider]  famotidine (PEPCID) 20 MG tablet Take 20 mg by mouth daily. 01/12/20  Yes [provider]  gabapentin (NEURONTIN) 300 MG capsule Take 300 mg by mouth 3 (three) times daily.   Yes [provider]  HYDROcodone-acetaminophen (NORCO/VICODIN) 5-325 MG tablet Take one tab po q 4 hrs prn pain 06/19/23  Yes Triplett, Tammy, PA-C  lisinopril-hydrochlorothiazide (ZESTORETIC) 10-12.5 MG tablet Take 1 tablet by mouth daily. 01/12/20  Yes [provider]  methocarbamol (ROBAXIN) 500 MG tablet Take 1 tablet  (500 mg total) by mouth 3 (three) times daily. 06/19/23  Yes Triplett, Tammy, PA-C  prazosin (MINIPRESS) 2 MG capsule Take 4 mg by mouth at bedtime.   Yes [provider]  rosuvastatin (CRESTOR) 5 MG tablet Take 5 mg by mouth daily.   Yes [provider]  triamcinolone ointment (KENALOG) 0.1 % Apply topically. 06/06/22  Yes [provider]    Allergies as of 05/30/2023 - Review Complete 05/17/2023  Allergen Reaction Noted   Latex Rash 06/13/2012    Family History  Problem Relation Age of Onset   Diabetes Mellitus II Mother    Breast cancer Mother 40   Breast cancer Maternal Aunt        dx <50, x3 maternal aunts   Ovarian cancer Maternal Aunt        dx <50   Colon cancer Maternal Uncle        dx <50   Leukemia Maternal Uncle        dx <50   Leukemia Paternal Uncle        dx after 60   Breast cancer Cousin        dx <50   Lung cancer Cousin        dx <50   Brain cancer Cousin        dx <50   Cancer - Other Cousin        sarcoma/thigh; ovarian pre cancer? dx <50   Li-Fraumeni syndrome Cousin  Throat cancer Cousin    Colon cancer Cousin    Brain cancer Cousin        d. 6 months    Social History   Socioeconomic History   Marital status: Married    Spouse name: Not on file   Number of children: Not on file   Years of education: Not on file   Highest education level: Not on file  Occupational History   Not on file  Tobacco Use   Smoking status: Former    Current packs/day: 0.00    Types: Cigarettes    Quit date: 01/03/2012    Years since quitting: 11.4    Passive exposure: Never   Smokeless tobacco: Never  Vaping Use   Vaping status: Never Used  Substance and Sexual Activity   Alcohol use: Yes    Alcohol/week: 3.0 standard drinks of alcohol    Types: 3 Cans of beer per week   Drug use: Never   Sexual activity: Not on file  Other Topics Concern   Not on file  Social History Narrative   Not on file   Social Determinants of  Health   Financial Resource Strain: Not on file  Food Insecurity: No Food Insecurity (06/10/2023)   Hunger Vital Sign    Worried About Running Out of Food in the Last Year: Never true    Ran Out of Food in the Last Year: Never true  Transportation Needs: No Transportation Needs (06/10/2023)   PRAPARE - Administrator, Civil Service (Medical): No    Lack of Transportation (Non-Medical): No  Physical Activity: Not on file  Stress: Not on file  Social Connections: Not on file  Intimate Partner Violence: Not At Risk (06/10/2023)   Humiliation, Afraid, Rape, and Kick questionnaire    Fear of Current or Ex-Partner: No    Emotionally Abused: No    Physically Abused: No    Sexually Abused: No    Review of Systems: See HPI, otherwise negative ROS  Physical Exam: Vital signs in last 24 hours: Temp:  [98.4 F (36.9 C)] 98.4 F (36.9 C) (10/29 1110) Pulse Rate:  [94] 94 (10/29 1110) Resp:  [14] 14 (10/29 1110) BP: (138)/(78) 138/78 (10/29 1110) SpO2:  [97 %] 97 % (10/29 1110)   General:   Alert,  Well-developed, well-nourished, pleasant and cooperative in NAD Head:  Normocephalic and atraumatic. Eyes:  Sclera clear, no icterus.   Conjunctiva pink. Ears:  Normal auditory acuity. Nose:  No deformity, discharge,  or lesions. Msk:  Symmetrical without gross deformities. Normal posture. Extremities:  Without clubbing, BL LE edema with venous stasis changes Neurologic:  Alert and  oriented x4;  grossly normal neurologically. Skin:  Intact without significant lesions or rashes. Psych:  Alert and cooperative. Normal mood and affect.  Impression/Plan: Brandon Strickland is here for a colonoscopy to be performed for colon cancer screening purposes.  The risks of the procedure including infection, bleed, or perforation as well as benefits, limitations, alternatives and imponderables have been reviewed with the patient. Questions have been answered. All parties agreeable.

## 2023-06-26 NOTE — Anesthesia Preprocedure Evaluation (Signed)
Anesthesia Evaluation  Patient identified by MRN, date of birth, ID band Patient awake    Reviewed: Allergy & Precautions, H&P , NPO status , Patient's Chart, lab work & pertinent test results, reviewed documented beta blocker date and time   Airway Mallampati: II  TM Distance: >3 FB Neck ROM: full    Dental no notable dental hx.    Pulmonary neg pulmonary ROS, former smoker   Pulmonary exam normal breath sounds clear to auscultation       Cardiovascular Exercise Tolerance: Good hypertension, negative cardio ROS  Rhythm:regular Rate:Normal     Neuro/Psych negative neurological ROS  negative psych ROS   GI/Hepatic negative GI ROS, Neg liver ROS,GERD  ,,  Endo/Other  negative endocrine ROS    Renal/GU negative Renal ROS  negative genitourinary   Musculoskeletal   Abdominal   Peds  Hematology negative hematology ROS (+)   Anesthesia Other Findings   Reproductive/Obstetrics negative OB ROS                             Anesthesia Physical Anesthesia Plan  ASA: 2  Anesthesia Plan: General   Post-op Pain Management:    Induction:   PONV Risk Score and Plan: Propofol infusion  Airway Management Planned:   Additional Equipment:   Intra-op Plan:   Post-operative Plan:   Informed Consent: I have reviewed the patients History and Physical, chart, labs and discussed the procedure including the risks, benefits and alternatives for the proposed anesthesia with the patient or authorized representative who has indicated his/her understanding and acceptance.     Dental Advisory Given  Plan Discussed with: CRNA  Anesthesia Plan Comments:        Anesthesia Quick Evaluation  

## 2023-06-26 NOTE — Discharge Instructions (Addendum)
  Colonoscopy Discharge Instructions  Read the instructions outlined below and refer to this sheet in the next few weeks. These discharge instructions provide you with general information on caring for yourself after you leave the hospital. Your doctor may also give you specific instructions. While your treatment has been planned according to the most current medical practices available, unavoidable complications occasionally occur.   ACTIVITY You may resume your regular activity, but move at a slower pace for the next 24 hours.  Take frequent rest periods for the next 24 hours.  Walking will help get rid of the air and reduce the bloated feeling in your belly (abdomen).  No driving for 24 hours (because of the medicine (anesthesia) used during the test).   Do not sign any important legal documents or operate any machinery for 24 hours (because of the anesthesia used during the test).  NUTRITION Drink plenty of fluids.  You may resume your normal diet as instructed by your doctor.  Begin with a light meal and progress to your normal diet. Heavy or fried foods are harder to digest and may make you feel sick to your stomach (nauseated).  Avoid alcoholic beverages for 24 hours or as instructed.  MEDICATIONS You may resume your normal medications unless your doctor tells you otherwise.  WHAT YOU CAN EXPECT TODAY Some feelings of bloating in the abdomen.  Passage of more gas than usual.  Spotting of blood in your stool or on the toilet paper.  IF YOU HAD POLYPS REMOVED DURING THE COLONOSCOPY: No aspirin products for 7 days or as instructed.  No alcohol for 7 days or as instructed.  Eat a soft diet for the next 24 hours.  FINDING OUT THE RESULTS OF YOUR TEST Not all test results are available during your visit. If your test results are not back during the visit, make an appointment with your caregiver to find out the results. Do not assume everything is normal if you have not heard from your  caregiver or the medical facility. It is important for you to follow up on all of your test results.  SEEK IMMEDIATE MEDICAL ATTENTION IF: You have more than a spotting of blood in your stool.  Your belly is swollen (abdominal distention).  You are nauseated or vomiting.  You have a temperature over 101.  You have abdominal pain or discomfort that is severe or gets worse throughout the day.   Your colonoscopy revealed 6 polyp(s) which I removed successfully. Await pathology results, my office will contact you. I recommend repeating colonoscopy in 3-5 years for surveillance purposes depending on pathology results  You also have diverticulosis and internal hemorrhoids. I would recommend increasing fiber in your diet or adding OTC Benefiber/Metamucil. Be sure to drink at least 4 to 6 glasses of water daily. Follow-up with GI as needed.   I hope you have a great rest of your week!  Hennie Duos. Marletta Lor, D.O. Gastroenterology and Hepatology [facility]

## 2023-06-27 LAB — SURGICAL PATHOLOGY

## 2023-07-02 ENCOUNTER — Encounter (HOSPITAL_COMMUNITY): Payer: Self-pay | Admitting: Internal Medicine

## 2023-07-02 NOTE — Anesthesia Postprocedure Evaluation (Signed)
Anesthesia Post Note  Patient: Brandon Strickland  Procedure(s) Performed: COLONOSCOPY WITH PROPOFOL POLYPECTOMY  Patient location during evaluation: Phase II Anesthesia Type: General Level of consciousness: awake Pain management: pain level controlled Vital Signs Assessment: post-procedure vital signs reviewed and stable Respiratory status: spontaneous breathing and respiratory function stable Cardiovascular status: blood pressure returned to baseline and stable Postop Assessment: no headache and no apparent nausea or vomiting Anesthetic complications: no Comments: Late entry   No notable events documented.   Last Vitals:  Vitals:   06/26/23 1110 06/26/23 1225  BP: 138/78 127/66  Pulse: 94 90  Resp: 14 18  Temp: 36.9 C 36.6 C  SpO2: 97% 96%    Last Pain:  Vitals:   06/26/23 1225  TempSrc: Oral  PainSc: 0-No pain                 Windell Norfolk

## 2024-09-24 ENCOUNTER — Other Ambulatory Visit (HOSPITAL_COMMUNITY): Payer: Self-pay | Admitting: Chiropractic Medicine

## 2024-09-24 DIAGNOSIS — M546 Pain in thoracic spine: Secondary | ICD-10-CM

## 2024-09-24 DIAGNOSIS — M542 Cervicalgia: Secondary | ICD-10-CM

## 2024-10-03 ENCOUNTER — Ambulatory Visit (HOSPITAL_COMMUNITY): Admission: RE | Admit: 2024-10-03 | Source: Ambulatory Visit

## 2024-10-03 ENCOUNTER — Encounter (HOSPITAL_COMMUNITY): Payer: Self-pay

## 2024-10-03 DIAGNOSIS — M546 Pain in thoracic spine: Secondary | ICD-10-CM

## 2024-10-03 DIAGNOSIS — M542 Cervicalgia: Secondary | ICD-10-CM
